# Patient Record
Sex: Female | Born: 1955 | Race: White | Hispanic: No | Smoking: Former smoker
Health system: Southern US, Community
[De-identification: ages and names within clinical notes are randomized; demographics above are authoritative.]

## PROBLEM LIST (undated history)

## (undated) DIAGNOSIS — I739 Peripheral vascular disease, unspecified: Secondary | ICD-10-CM

## (undated) DIAGNOSIS — F329 Major depressive disorder, single episode, unspecified: Secondary | ICD-10-CM

## (undated) DIAGNOSIS — R51 Headache: Secondary | ICD-10-CM

## (undated) DIAGNOSIS — F32A Depression, unspecified: Secondary | ICD-10-CM

## (undated) DIAGNOSIS — T7840XA Allergy, unspecified, initial encounter: Secondary | ICD-10-CM

## (undated) DIAGNOSIS — F509 Eating disorder, unspecified: Secondary | ICD-10-CM

## (undated) HISTORY — DX: Allergy, unspecified, initial encounter: T78.40XA

## (undated) HISTORY — DX: Eating disorder, unspecified: F50.9

## (undated) HISTORY — DX: Peripheral vascular disease, unspecified: I73.9

## (undated) HISTORY — PX: COLONOSCOPY: SHX174

## (undated) HISTORY — PX: SKIN CANCER EXCISION: SHX779

## (undated) HISTORY — DX: Major depressive disorder, single episode, unspecified: F32.9

## (undated) HISTORY — DX: Headache: R51

## (undated) HISTORY — PX: TONSILLECTOMY: SHX5217

## (undated) HISTORY — DX: Depression, unspecified: F32.A

---

## 1998-07-08 ENCOUNTER — Other Ambulatory Visit: Admission: RE | Admit: 1998-07-08 | Discharge: 1998-07-08 | Payer: Self-pay | Admitting: *Deleted

## 1999-08-10 ENCOUNTER — Other Ambulatory Visit: Admission: RE | Admit: 1999-08-10 | Discharge: 1999-08-10 | Payer: Self-pay | Admitting: *Deleted

## 2000-08-16 ENCOUNTER — Other Ambulatory Visit: Admission: RE | Admit: 2000-08-16 | Discharge: 2000-08-16 | Payer: Self-pay | Admitting: *Deleted

## 2001-11-11 ENCOUNTER — Other Ambulatory Visit: Admission: RE | Admit: 2001-11-11 | Discharge: 2001-11-11 | Payer: Self-pay | Admitting: Obstetrics and Gynecology

## 2002-12-25 ENCOUNTER — Other Ambulatory Visit: Admission: RE | Admit: 2002-12-25 | Discharge: 2002-12-25 | Payer: Self-pay | Admitting: Obstetrics and Gynecology

## 2004-02-29 ENCOUNTER — Other Ambulatory Visit: Admission: RE | Admit: 2004-02-29 | Discharge: 2004-02-29 | Payer: Self-pay | Admitting: Obstetrics and Gynecology

## 2005-06-05 ENCOUNTER — Other Ambulatory Visit: Admission: RE | Admit: 2005-06-05 | Discharge: 2005-06-05 | Payer: Self-pay | Admitting: Obstetrics and Gynecology

## 2007-11-12 ENCOUNTER — Ambulatory Visit: Payer: Self-pay | Admitting: Family Medicine

## 2007-11-12 ENCOUNTER — Ambulatory Visit: Payer: Self-pay | Admitting: Internal Medicine

## 2007-11-12 DIAGNOSIS — R519 Headache, unspecified: Secondary | ICD-10-CM | POA: Insufficient documentation

## 2007-11-12 DIAGNOSIS — J309 Allergic rhinitis, unspecified: Secondary | ICD-10-CM | POA: Insufficient documentation

## 2007-11-12 DIAGNOSIS — R51 Headache: Secondary | ICD-10-CM | POA: Insufficient documentation

## 2007-11-19 ENCOUNTER — Ambulatory Visit: Payer: Self-pay

## 2007-11-25 ENCOUNTER — Ambulatory Visit: Payer: Self-pay | Admitting: Family Medicine

## 2008-04-20 ENCOUNTER — Other Ambulatory Visit: Admission: RE | Admit: 2008-04-20 | Discharge: 2008-04-20 | Payer: Self-pay | Admitting: Obstetrics and Gynecology

## 2008-12-22 ENCOUNTER — Telehealth: Payer: Self-pay | Admitting: Family Medicine

## 2009-03-10 ENCOUNTER — Ambulatory Visit: Payer: Self-pay | Admitting: Family Medicine

## 2009-03-10 DIAGNOSIS — L03019 Cellulitis of unspecified finger: Secondary | ICD-10-CM

## 2009-03-10 DIAGNOSIS — L02519 Cutaneous abscess of unspecified hand: Secondary | ICD-10-CM | POA: Insufficient documentation

## 2009-03-21 ENCOUNTER — Ambulatory Visit: Payer: Self-pay | Admitting: Family Medicine

## 2009-03-25 ENCOUNTER — Ambulatory Visit: Payer: Self-pay | Admitting: Family Medicine

## 2009-03-29 ENCOUNTER — Ambulatory Visit: Payer: Self-pay | Admitting: Family Medicine

## 2009-03-31 ENCOUNTER — Ambulatory Visit: Payer: Self-pay | Admitting: Family Medicine

## 2009-03-31 LAB — CONVERTED CEMR LAB
ALT: 21 units/L (ref 0–35)
AST: 25 units/L (ref 0–37)
Alkaline Phosphatase: 57 units/L (ref 39–117)
BUN: 17 mg/dL (ref 6–23)
Basophils Relative: 0.1 % (ref 0.0–3.0)
Bilirubin, Direct: 0.1 mg/dL (ref 0.0–0.3)
Chloride: 103 meq/L (ref 96–112)
Eosinophils Absolute: 0.1 10*3/uL (ref 0.0–0.7)
Eosinophils Relative: 1.3 % (ref 0.0–5.0)
Glucose, Bld: 91 mg/dL (ref 70–99)
Hemoglobin, Urine: NEGATIVE
LDL Cholesterol: 76 mg/dL (ref 0–99)
Lymphocytes Relative: 18.6 % (ref 12.0–46.0)
Monocytes Relative: 11.5 % (ref 3.0–12.0)
Neutrophils Relative %: 68.5 % (ref 43.0–77.0)
Potassium: 4.5 meq/L (ref 3.5–5.1)
RBC: 4.49 M/uL (ref 3.87–5.11)
Total CHOL/HDL Ratio: 3
Total Protein, Urine: NEGATIVE mg/dL
Total Protein: 6.2 g/dL (ref 6.0–8.3)
Urine Glucose: NEGATIVE mg/dL
VLDL: 8.4 mg/dL (ref 0.0–40.0)
WBC: 7.3 10*3/uL (ref 4.5–10.5)

## 2009-04-11 ENCOUNTER — Ambulatory Visit: Payer: Self-pay | Admitting: Family Medicine

## 2009-04-11 DIAGNOSIS — R5383 Other fatigue: Secondary | ICD-10-CM

## 2009-04-11 DIAGNOSIS — R252 Cramp and spasm: Secondary | ICD-10-CM | POA: Insufficient documentation

## 2009-04-11 DIAGNOSIS — R5381 Other malaise: Secondary | ICD-10-CM | POA: Insufficient documentation

## 2010-02-08 ENCOUNTER — Encounter: Payer: Self-pay | Admitting: Family Medicine

## 2010-02-08 ENCOUNTER — Ambulatory Visit: Payer: Self-pay | Admitting: Internal Medicine

## 2010-08-02 ENCOUNTER — Ambulatory Visit
Admission: RE | Admit: 2010-08-02 | Discharge: 2010-08-02 | Payer: Self-pay | Source: Home / Self Care | Attending: Family Medicine | Admitting: Family Medicine

## 2010-08-02 DIAGNOSIS — L253 Unspecified contact dermatitis due to other chemical products: Secondary | ICD-10-CM | POA: Insufficient documentation

## 2010-08-20 LAB — CONVERTED CEMR LAB
ALT: 21 units/L (ref 0–35)
AST: 26 units/L (ref 0–37)
Basophils Absolute: 0 10*3/uL (ref 0.0–0.1)
Basophils Relative: 0.1 % (ref 0.0–1.0)
Bilirubin, Direct: 0.1 mg/dL (ref 0.0–0.3)
CO2: 32 meq/L (ref 19–32)
Chloride: 104 meq/L (ref 96–112)
Cholesterol: 146 mg/dL (ref 0–200)
Creatinine, Ser: 0.6 mg/dL (ref 0.4–1.2)
Glucose, Bld: 98 mg/dL (ref 70–99)
Hemoglobin: 14.1 g/dL (ref 12.0–15.0)
LDL Cholesterol: 74 mg/dL (ref 0–99)
Lymphocytes Relative: 31.5 % (ref 12.0–46.0)
MCHC: 33.6 g/dL (ref 30.0–36.0)
Monocytes Relative: 9.6 % (ref 3.0–12.0)
Neutrophils Relative %: 57.7 % (ref 43.0–77.0)
Nitrite: NEGATIVE
Protein, U semiquant: NEGATIVE
RBC: 4.71 M/uL (ref 3.87–5.11)
Total Bilirubin: 0.7 mg/dL (ref 0.3–1.2)
Total CHOL/HDL Ratio: 2.4
Total Protein: 6.8 g/dL (ref 6.0–8.3)
Urobilinogen, UA: 0.2
VLDL: 12 mg/dL (ref 0–40)
WBC Urine, dipstick: NEGATIVE
WBC: 5 10*3/uL (ref 4.5–10.5)

## 2010-08-22 NOTE — Miscellaneous (Signed)
Summary: BONE DENSITY  Clinical Lists Changes  Orders: Added new Test order of T-Bone Densitometry (77080) - Signed Added new Test order of T-Lumbar Vertebral Assessment (77082) - Signed 

## 2010-08-24 NOTE — Assessment & Plan Note (Signed)
Summary: inflamed eyes/dm   Vital Signs:  Patient profile:   55 year old female Menstrual status:  postmenopausal Height:      65.5 inches Weight:      162 pounds BMI:     26.64 Temp:     98.1 degrees F oral BP sitting:   120 / 84  (left arm)  Vitals Entered By: Kern Reap CMA Duncan Dull) (August 02, 2010 9:36 AM) CC: eyes swelling   CC:  eyes swelling.  History of Present Illness: Katie Fischer is a 55 year old female, who comes in today for evaluation of a allergic reaction to make up in her eyes.  The past month.  Both eyelids been swollen.  She remove the offending agent however, eructation, and redness is still persists  Allergies: 1)  ! Codeine  Past History:  Past medical, surgical, family and social histories (including risk factors) reviewed for relevance to current acute and chronic problems.  Past Medical History: Reviewed history from 11/25/2007 and no changes required. Allergic rhinitis Headache tonsillectomy teenage eating disorder Depression Peripheral vascular disease  carotid and aortic bruits  Family History: Reviewed history from 11/12/2007 and no changes required. father died at 52.  COPD smoker  mother died 57, diabetes, high blood pressure, hyperlipidemia, congestive heart failure, hypothyroidism, and alcoholism .  No brother,sone sister in good health  Social History: Reviewed history from 11/12/2007 and no changes required. 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 Uad  Complete Medication List: 1)  Multivitamins Caps (Multiple vitamin) 2)  Calcium  3)  Fish Oil Oil (Fish oil) 4)  Vitamin E Complete Caps (Vitamin mixture) 5)  Aspirin 81 Mg Tbec (Aspirin) 6)  Doxycycline Hyclate 100 Mg Caps (Doxycycline hyclate) .... One tab two times a day 7)  Celexa 20 Mg Tabs (Citalopram hydrobromide) .Marland Kitchen.. 1 tab @ bedtime 8)  Prednisone 20 Mg Tabs (Prednisone) .... Uad  Patient Instructions: 1)  prednisone two tabs x 3 days, one x 3 days, a half x 3 days, then a half a tablet Monday, Wednesday, Friday, for a two week taper.  Return p.r.n. Prescriptions: PREDNISONE 20 MG TABS (PREDNISONE) UAD  #30 x 1   Entered and Authorized by:   Roderick Pee MD   Signed by:   Roderick Pee MD on 08/02/2010   Method used:   Electronically to        [facility] [phone]* (retail)       [address]       [place], [place]  [address]       Ph: [phone] or [phone]       Fax: [phone]   RxID:   [id number]    Orders Added: 1)  Est. Patient Level III [47425]

## 2011-07-09 ENCOUNTER — Ambulatory Visit (INDEPENDENT_AMBULATORY_CARE_PROVIDER_SITE_OTHER): Payer: BC Managed Care – PPO | Admitting: Family Medicine

## 2011-07-09 ENCOUNTER — Encounter: Payer: Self-pay | Admitting: Family Medicine

## 2011-07-09 VITALS — BP 108/74 | Temp 98.1°F | Ht 65.5 in | Wt 156.0 lb

## 2011-07-09 DIAGNOSIS — J45901 Unspecified asthma with (acute) exacerbation: Secondary | ICD-10-CM

## 2011-07-09 MED ORDER — PREDNISONE 20 MG PO TABS: ORAL_TABLET | ORAL | Status: DC

## 2011-07-09 MED ORDER — HYDROCODONE-HOMATROPINE 5-1.5 MG/5ML PO SYRP: ORAL_SOLUTION | ORAL | Status: DC

## 2011-07-09 MED ORDER — DOXYCYCLINE HYCLATE 100 MG PO TABS: 100.0000 mg | ORAL_TABLET | Freq: Two times a day (BID) | ORAL | Status: AC

## 2011-07-09 NOTE — Patient Instructions (Signed)
Drinks lots of water.  Run a vaporizer in her bedroom at night.  Prednisone as directed.  Doxycycline one twice a day for 10 days for the discolored sputum.  Hydromet one half to 1 teaspoon up to 3 times daily p.r.n. For cough.  Return p.r.n.

## 2011-07-09 NOTE — Progress Notes (Signed)
  Subjective:    Patient ID: Christyne Mccain, female    DOB: Jun 26, 1956, 55 y.o.   MRN: [id number]  HPI Dareen is a 55 year old, married female, nonsmoker, who comes in today with a cough for 3 weeks.  She states that 3 weeks ago she developed a cold and cold symptoms went away, but over the weekend, she began wheezing.  She's never had problems asthma in the past.  Then, nonsmoker.  No fever, some discolored sputum, but otherwise well.    Review of Systems    General and pulmonary review of systems otherwise negative, except she has had a history of allergic rhinitis in the past Objective:   Physical Exam Well-developed well-nourished, female, in no acute distress.  HEENT negative.  Neck was supple.  No adenopathy.  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  Plan lots of liquids, Hydromet, doxycycline, 100 b.i.d., prednisone burst and taper,

## 2012-01-07 ENCOUNTER — Other Ambulatory Visit (INDEPENDENT_AMBULATORY_CARE_PROVIDER_SITE_OTHER): Payer: 59

## 2012-01-07 DIAGNOSIS — Z0000 Encounter for general adult medical examination without abnormal findings: Secondary | ICD-10-CM

## 2012-01-07 LAB — POCT URINALYSIS DIPSTICK
Bilirubin, UA: NEGATIVE
Ketones, UA: NEGATIVE
Leukocytes, UA: NEGATIVE
Nitrite, UA: NEGATIVE
Protein, UA: NEGATIVE

## 2012-01-07 LAB — HEPATIC FUNCTION PANEL
ALT: 32 U/L (ref 0–35)
AST: 37 U/L (ref 0–37)
Albumin: 3.7 g/dL (ref 3.5–5.2)
Alkaline Phosphatase: 65 U/L (ref 39–117)

## 2012-01-07 LAB — LIPID PANEL
Total CHOL/HDL Ratio: 2
VLDL: 5.8 mg/dL (ref 0.0–40.0)

## 2012-01-07 LAB — CBC WITH DIFFERENTIAL/PLATELET
Eosinophils Relative: 0.9 % (ref 0.0–5.0)
HCT: 40.1 % (ref 36.0–46.0)
Hemoglobin: 12.9 g/dL (ref 12.0–15.0)
Lymphs Abs: 1.5 10*3/uL (ref 0.7–4.0)
Monocytes Relative: 11.4 % (ref 3.0–12.0)
Neutro Abs: 2.9 10*3/uL (ref 1.4–7.7)
RBC: 4.51 Mil/uL (ref 3.87–5.11)
WBC: 5.1 10*3/uL (ref 4.5–10.5)

## 2012-01-07 LAB — BASIC METABOLIC PANEL
GFR: 107.6 mL/min (ref >60.00)
Potassium: 3.9 mEq/L (ref 3.5–5.1)
Sodium: 139 mEq/L (ref 135–145)

## 2012-01-07 LAB — TSH: TSH: 1.7 u[IU]/mL (ref 0.35–5.50)

## 2012-01-14 ENCOUNTER — Other Ambulatory Visit: Payer: BC Managed Care – PPO

## 2012-02-04 ENCOUNTER — Ambulatory Visit (INDEPENDENT_AMBULATORY_CARE_PROVIDER_SITE_OTHER): Payer: 59 | Admitting: Family Medicine

## 2012-02-04 ENCOUNTER — Encounter: Payer: Self-pay | Admitting: Family Medicine

## 2012-02-04 VITALS — BP 124/80 | Temp 98.0°F | Ht 65.25 in | Wt 155.0 lb

## 2012-02-04 DIAGNOSIS — R5383 Other fatigue: Secondary | ICD-10-CM

## 2012-02-04 DIAGNOSIS — Z0000 Encounter for general adult medical examination without abnormal findings: Secondary | ICD-10-CM | POA: Insufficient documentation

## 2012-02-04 DIAGNOSIS — R5381 Other malaise: Secondary | ICD-10-CM

## 2012-02-04 MED ORDER — AMITRIPTYLINE HCL 10 MG PO TABS: 10.0000 mg | ORAL_TABLET | Freq: Every day | ORAL | Status: DC

## 2012-02-04 NOTE — Patient Instructions (Signed)
Take Elavil 10 mg at bedtime  Only have one cup of caffeinated beverages in the morning  Walk 30 minutes daily  Return in one year sooner if any problems

## 2012-02-04 NOTE — Progress Notes (Signed)
  Subjective:    Patient ID: Katie Fischer, female    DOB: [birth year], 56 y.o.   MRN: [id number]  HPI Katie Fischer is a 56 year old married female nonsmoker who comes in today for general physical examination  She's always been in good health she's had no chronic health problems. She states she is working full time and now feels tired and fatigued for the past year. Her sleep is interrupted by urination x2 however she also wakes up thinking of things she needs to do.  She gets routine eye care, dental care, BSE monthly, and you mammography, tetanus 2010  Pap year ago normal recommended Pap every 3 years   Review of Systems  Constitutional: Negative.   HENT: Negative.   Eyes: Negative.   Respiratory: Negative.   Cardiovascular: Negative.   Gastrointestinal: Negative.   Genitourinary: Negative.   Musculoskeletal: Negative.   Neurological: Negative.   Hematological: Negative.   Psychiatric/Behavioral: Negative.        Objective:   Physical Exam  Constitutional: She appears well-developed and well-nourished.  HENT:  Head: Normocephalic and atraumatic.  Right Ear: External ear normal.  Left Ear: External ear normal.  Nose: Nose normal.  Mouth/Throat: Oropharynx is clear and moist.  Eyes: EOM are normal. Pupils are equal, round, and reactive to light.  Neck: Normal range of motion. Neck supple. No thyromegaly present.  Cardiovascular: Normal rate, regular rhythm, normal heart sounds and intact distal pulses.  Exam reveals no gallop and no friction rub.   No murmur heard. Pulmonary/Chest: Effort normal and breath sounds normal.  Abdominal: Soft. Bowel sounds are normal. She exhibits no distension and no mass. There is no tenderness. There is no rebound.  Genitourinary:       Bilateral breast exam normal  Musculoskeletal: Normal range of motion.  Lymphadenopathy:    She has no cervical adenopathy.  Neurological: She is alert. She has normal reflexes. No cranial nerve deficit. She  exhibits normal muscle tone. Coordination normal.  Skin: Skin is warm and dry.       Total body skin exam normal  Psychiatric: She has a normal mood and affect. Her behavior is normal. Judgment and thought content normal.          Assessment & Plan:  Healthy female  Fatigue probably secondary to sleep dysfunction trial of low-dose Elavil

## 2012-09-30 ENCOUNTER — Encounter: Payer: Self-pay | Admitting: Family Medicine

## 2013-01-21 ENCOUNTER — Other Ambulatory Visit: Payer: Self-pay | Admitting: Family Medicine

## 2013-03-27 ENCOUNTER — Other Ambulatory Visit (INDEPENDENT_AMBULATORY_CARE_PROVIDER_SITE_OTHER): Payer: 59

## 2013-03-27 DIAGNOSIS — Z0000 Encounter for general adult medical examination without abnormal findings: Secondary | ICD-10-CM

## 2013-03-27 LAB — HEPATIC FUNCTION PANEL
ALT: 28 U/L (ref 0–35)
Alkaline Phosphatase: 53 U/L (ref 39–117)
Bilirubin, Direct: 0 mg/dL (ref 0.0–0.3)
Total Protein: 6.3 g/dL (ref 6.0–8.3)

## 2013-03-27 LAB — CBC WITH DIFFERENTIAL/PLATELET
Basophils Relative: 0.7 % (ref 0.0–3.0)
Eosinophils Relative: 1.6 % (ref 0.0–5.0)
HCT: 40.8 % (ref 36.0–46.0)
Hemoglobin: 13.5 g/dL (ref 12.0–15.0)
MCV: 87.2 fl (ref 78.0–100.0)
Monocytes Absolute: 0.5 10*3/uL (ref 0.1–1.0)
Neutrophils Relative %: 55.7 % (ref 43.0–77.0)
RBC: 4.68 Mil/uL (ref 3.87–5.11)
WBC: 5.2 10*3/uL (ref 4.5–10.5)

## 2013-03-27 LAB — BASIC METABOLIC PANEL
Chloride: 102 mEq/L (ref 96–112)
Creatinine, Ser: 0.6 mg/dL (ref 0.4–1.2)
Potassium: 3.8 mEq/L (ref 3.5–5.1)
Sodium: 137 mEq/L (ref 135–145)

## 2013-03-27 LAB — LIPID PANEL
LDL Cholesterol: 78 mg/dL (ref 0–99)
Total CHOL/HDL Ratio: 3
VLDL: 11.2 mg/dL (ref 0.0–40.0)

## 2013-03-27 LAB — POCT URINALYSIS DIPSTICK
Ketones, UA: NEGATIVE
Protein, UA: NEGATIVE
Spec Grav, UA: 1.01
pH, UA: 7

## 2013-04-14 ENCOUNTER — Encounter: Payer: Self-pay | Admitting: Internal Medicine

## 2013-04-14 ENCOUNTER — Ambulatory Visit (INDEPENDENT_AMBULATORY_CARE_PROVIDER_SITE_OTHER): Payer: 59 | Admitting: Internal Medicine

## 2013-04-14 VITALS — BP 126/70 | HR 71 | Temp 98.4°F | Resp 18 | Ht 65.75 in | Wt 159.0 lb

## 2013-04-14 DIAGNOSIS — R252 Cramp and spasm: Secondary | ICD-10-CM

## 2013-04-14 DIAGNOSIS — Z23 Encounter for immunization: Secondary | ICD-10-CM

## 2013-04-14 DIAGNOSIS — Z0000 Encounter for general adult medical examination without abnormal findings: Secondary | ICD-10-CM

## 2013-04-14 MED ORDER — AMITRIPTYLINE HCL 10 MG PO TABS: ORAL_TABLET | ORAL | Status: DC

## 2013-04-14 NOTE — Progress Notes (Signed)
Subjective:    Patient ID: Katie Fischer, female    DOB: 03-25-1956, 57 y.o.   MRN: [id number]  HPI  57 year old patient who is seen today for a preventive health examination. She enjoys excellent health. Her only concerns are occasional insomnia as well as nocturnal leg cramps. Her last colonoscopy was about 5 years ago. She does exercise regularly at the [facility] 3-4 times per week.  Past medical history otherwise unremarkable Past surgical history colonoscopy 5 years ago;  remote tonsillectomy in the fourth grade  Social history. 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 3 dogs; discontinued tobacco use in 12-17-1987  Family history father died at age 75 complications of COPD and a nonmalignant colonic obstruction Mother died at 25 history of alcohol use pernicious anemia chronic kidney disease diabetes and hypertension   Past Medical History  Diagnosis Date  . Allergy   . Headache(784.0)   . Eating disorder     as a teenager  . Depression   . PVD (peripheral vascular disease)     History   Social History  . Marital Status: Married    Spouse Name: N/A    Number of Children: N/A  . Years of Education: N/A   Occupational History  . Not on file.   Social History Main Topics  . Smoking status: Former Games developer  . Smokeless tobacco: Not on file  . Alcohol Use: Yes  . Drug Use: No  . Sexual Activity:    Other Topics Concern  . Not on file   Social History Narrative  . No narrative on file    Past Surgical History  Procedure Laterality Date  . Tonsillectomy      Family History  Problem Relation Age of Onset  . Diabetes Mother   . Hypertension Mother   . Hyperlipidemia Mother   . Alcohol abuse Mother   . Thyroid disease Mother   . COPD Father     Allergies  Allergen Reactions  . Codeine     REACTION: hives    Current Outpatient Prescriptions on File Prior to Visit  Medication Sig Dispense Refill  . amitriptyline (ELAVIL) 10 MG tablet TAKE ONE  TABLET BY MOUTH AT BEDTIME  100 tablet  3  . aspirin 81 MG tablet Take 81 mg by mouth daily.        . calcium carbonate 200 MG capsule Take 250 mg by mouth daily.        . fish oil-omega-3 fatty acids 1000 MG capsule Take 1 g by mouth daily.        . Multiple Vitamin (MULTIVITAMIN) tablet Take 1 tablet by mouth daily.        . Probiotic Product (PROBIOTIC DAILY PO) Take by mouth.      . Vitamin Mixture (VITAMIN E COMPLETE PO) Take 1 capsule by mouth.         No current facility-administered medications on file prior to visit.    BP 126/70  Pulse 71  Temp(Src) 98.4 F (36.9 C) (Oral)  Resp 18  Ht 5' 5.75" (1.67 m)  Wt 159 lb (72.122 kg)  BMI 25.86 kg/m2  SpO2 98%     Review of Systems  Constitutional: Negative for fever, appetite change, fatigue and unexpected weight change.  HENT: Negative for hearing loss, ear pain, nosebleeds, congestion, sore throat, mouth sores, trouble swallowing, neck stiffness, dental problem, voice change, sinus pressure and tinnitus.   Eyes: Negative for photophobia, pain, redness and visual disturbance.  Respiratory:  Negative for cough, chest tightness and shortness of breath.   Cardiovascular: Negative for chest pain, palpitations and leg swelling.  Gastrointestinal: Negative for nausea, vomiting, abdominal pain, diarrhea, constipation, blood in stool, abdominal distention and rectal pain.  Genitourinary: Negative for dysuria, urgency, frequency, hematuria, flank pain, vaginal bleeding, vaginal discharge, difficulty urinating, genital sores, vaginal pain, menstrual problem and pelvic pain.  Musculoskeletal: Negative for back pain and arthralgias.  Skin: Negative for rash.  Neurological: Negative for dizziness, syncope, speech difficulty, weakness, light-headedness, numbness and headaches.  Hematological: Negative for adenopathy. Does not bruise/bleed easily.  Psychiatric/Behavioral: Negative for suicidal ideas, behavioral problems, self-injury,  dysphoric mood and agitation. The patient is not nervous/anxious.        Objective:   Physical Exam  Constitutional: She is oriented to person, place, and time. She appears well-developed and well-nourished.  HENT:  Head: Normocephalic and atraumatic.  Right Ear: External ear normal.  Left Ear: External ear normal.  Mouth/Throat: Oropharynx is clear and moist.  Eyes: Conjunctivae and EOM are normal.  Neck: Normal range of motion. Neck supple. No JVD present. No thyromegaly present.  Cardiovascular: Normal rate, regular rhythm, normal heart sounds and intact distal pulses.   No murmur heard. Pulmonary/Chest: Effort normal and breath sounds normal. She has no wheezes. She has no rales.  Abdominal: Soft. Bowel sounds are normal. She exhibits no distension and no mass. There is no tenderness. There is no rebound and no guarding.  Musculoskeletal: Normal range of motion. She exhibits no edema and no tenderness.  Neurological: She is alert and oriented to person, place, and time. She has normal reflexes. No cranial nerve deficit. She exhibits normal muscle tone. Coordination normal.  Skin: Skin is warm and dry. No rash noted.  Psychiatric: She has a normal mood and affect. Her behavior is normal.          Assessment & Plan:   Preventive health examination Nocturnal leg cramps. We'll try stretching prior to bedtime  Laboratory studies reviewed Recheck in one year or as needed Continue exercise regimen and heart healthy diet

## 2013-04-14 NOTE — Patient Instructions (Addendum)
It is important that you exercise regularly, at least 20 minutes 3 to 4 times per week.  If you develop chest pain or shortness of breath seek  medical attention.  Take a calcium supplement, plus [phone] units of vitamin D  Return in one year for follow-up Leg Cramps Leg cramps that occur during exercise can be caused by poor circulation or dehydration. However, muscle cramps that occur at rest or during the night are usually not due to any serious medical problem. Heat cramps may cause muscle spasms during hot weather.  CAUSES There is no clear cause for muscle cramps. However, dehydration may be a factor for those who do not drink enough fluids and those who exercise in the heat. Imbalances in the level of sodium, potassium, calcium or magnesium in the muscle tissue may also be a factor. Some medications, such as water pills (diuretics), may cause loss of chemicals that the body needs (like sodium and potassium) and cause muscle cramps. TREATMENT   Make sure your diet has enough fluids and essential minerals for the muscle to work normally.  Avoid strenuous exercise for several days if you have been having frequent leg cramps.  Stretch and massage the cramped muscle for several minutes.  Some medicines may be helpful in some patients with night cramps. Only take over-the-counter or prescription medicines as directed by your caregiver. SEEK IMMEDIATE MEDICAL CARE IF:   Your leg cramps become worse.  Your foot becomes cold, numb, or blue. Document Released: 08/16/2004 Document Revised: 10/01/2011 Document Reviewed: 08/03/2008 [facility] Patient Information 2014 [place], [place]. Insomnia Insomnia is frequent trouble falling and/or staying asleep. Insomnia can be a long term problem or a short term problem. Both are common. Insomnia can be a short term problem when the wakefulness is related to a certain stress or worry. Long term insomnia is often related to ongoing stress during waking  hours and/or poor sleeping habits. Overtime, sleep deprivation itself can make the problem worse. Every little thing feels more severe because you are overtired and your ability to cope is decreased. CAUSES   Stress, anxiety, and depression.  Poor sleeping habits.  Distractions such as TV in the bedroom.  Naps close to bedtime.  Engaging in emotionally charged conversations before bed.  Technical reading before sleep.  Alcohol and other sedatives. They may make the problem worse. They can hurt normal sleep patterns and normal dream activity.  Stimulants such as caffeine for several hours prior to bedtime.  Pain syndromes and shortness of breath can cause insomnia.  Exercise late at night.  Changing time zones may cause sleeping problems (jet lag). It is sometimes helpful to have someone observe your sleeping patterns. They should look for periods of not breathing during the night (sleep apnea). They should also look to see how long those periods last. If you live alone or observers are uncertain, you can also be observed at a sleep clinic where your sleep patterns will be professionally monitored. Sleep apnea requires a checkup and treatment. Give your caregivers your medical history. Give your caregivers observations your family has made about your sleep.  SYMPTOMS   Not feeling rested in the morning.  Anxiety and restlessness at bedtime.  Difficulty falling and staying asleep. TREATMENT   Your caregiver may prescribe treatment for an underlying medical disorders. Your caregiver can give advice or help if you are using alcohol or other drugs for self-medication. Treatment of underlying problems will usually eliminate insomnia problems.  Medications can be prescribed for  short time use. They are generally not recommended for lengthy use.  Over-the-counter sleep medicines are not recommended for lengthy use. They can be habit forming.  You can promote easier sleeping by making  lifestyle changes such as:  Using relaxation techniques that help with breathing and reduce muscle tension.  Exercising earlier in the day.  Changing your diet and the time of your last meal. No night time snacks.  Establish a regular time to go to bed.  Counseling can help with stressful problems and worry.  Soothing music and white noise may be helpful if there are background noises you cannot remove.  Stop tedious detailed work at least one hour before bedtime. HOME CARE INSTRUCTIONS   Keep a diary. Inform your caregiver about your progress. This includes any medication side effects. See your caregiver regularly. Take note of:  Times when you are asleep.  Times when you are awake during the night.  The quality of your sleep.  How you feel the next day. This information will help your caregiver care for you.  Get out of bed if you are still awake after 15 minutes. Read or do some quiet activity. Keep the lights down. Wait until you feel sleepy and go back to bed.  Keep regular sleeping and waking hours. Avoid naps.  Exercise regularly.  Avoid distractions at bedtime. Distractions include watching television or engaging in any intense or detailed activity like attempting to balance the household checkbook.  Develop a bedtime ritual. Keep a familiar routine of bathing, brushing your teeth, climbing into bed at the same time each night, listening to soothing music. Routines increase the success of falling to sleep faster.  Use relaxation techniques. This can be using breathing and muscle tension release routines. It can also include visualizing peaceful scenes. You can also help control troubling or intruding thoughts by keeping your mind occupied with boring or repetitive thoughts like the old concept of counting sheep. You can make it more creative like imagining planting one beautiful flower after another in your backyard garden.  During your day, work to eliminate stress.  When this is not possible use some of the previous suggestions to help reduce the anxiety that accompanies stressful situations. MAKE SURE YOU:   Understand these instructions.  Will watch your condition.  Will get help right away if you are not doing well or get worse. Document Released: 07/06/2000 Document Revised: 10/01/2011 Document Reviewed: 08/06/2007 [facility] Patient Information 2014 [place], [place].

## 2013-05-01 ENCOUNTER — Ambulatory Visit (INDEPENDENT_AMBULATORY_CARE_PROVIDER_SITE_OTHER): Payer: 59 | Admitting: Family

## 2013-05-01 ENCOUNTER — Encounter: Payer: Self-pay | Admitting: Family

## 2013-05-01 VITALS — BP 118/80 | HR 78 | Temp 98.6°F | Wt 158.0 lb

## 2013-05-01 DIAGNOSIS — R059 Cough, unspecified: Secondary | ICD-10-CM

## 2013-05-01 DIAGNOSIS — R05 Cough: Secondary | ICD-10-CM

## 2013-05-01 DIAGNOSIS — J209 Acute bronchitis, unspecified: Secondary | ICD-10-CM

## 2013-05-01 MED ORDER — METHYLPREDNISOLONE 4 MG PO KIT: PACK | ORAL | Status: AC

## 2013-05-01 NOTE — Patient Instructions (Signed)

## 2013-05-01 NOTE — Progress Notes (Signed)
Subjective:    Patient ID: Katie Fischer, female    DOB: 04/08/1956, 57 y.o.   MRN: [id number]  HPI 57 year old white female, nonsmoker, patient of Dr. Tawanna Cooler is in today with complaints of cough, chest congestion, yellow phlegm production 58 days. She's been taking over-the-counter Mucinex DM without much relief. Denies any fever muscle aches or pain.   Review of Systems  Constitutional: Negative.   HENT: Positive for congestion, postnasal drip, rhinorrhea and sinus pressure.   Respiratory: Positive for cough and shortness of breath.   Cardiovascular: Negative.   Gastrointestinal: Negative.   Endocrine: Negative.   Skin: Negative.   Allergic/Immunologic: Negative.   Psychiatric/Behavioral: Negative.    Past Medical History  Diagnosis Date  . Allergy   . Headache(784.0)   . Eating disorder     as a teenager  . Depression   . PVD (peripheral vascular disease)     History   Social History  . Marital Status: Married    Spouse Name: N/A    Number of Children: N/A  . Years of Education: N/A   Occupational History  . Not on file.   Social History Main Topics  . Smoking status: Former Games developer  . Smokeless tobacco: Not on file  . Alcohol Use: Yes  . Drug Use: No  . Sexual Activity:    Other Topics Concern  . Not on file   Social History Narrative  . No narrative on file    Past Surgical History  Procedure Laterality Date  . Tonsillectomy      Family History  Problem Relation Age of Onset  . Diabetes Mother   . Hypertension Mother   . Hyperlipidemia Mother   . Alcohol abuse Mother   . Thyroid disease Mother   . COPD Father     Allergies  Allergen Reactions  . Codeine     REACTION: hives    Current Outpatient Prescriptions on File Prior to Visit  Medication Sig Dispense Refill  . amitriptyline (ELAVIL) 10 MG tablet TAKE ONE TABLET BY MOUTH AT BEDTIME  100 tablet  3  . aspirin 81 MG tablet Take 81 mg by mouth daily.        . calcium carbonate 200 MG  capsule Take 250 mg by mouth daily.        . fish oil-omega-3 fatty acids 1000 MG capsule Take 1 g by mouth daily.        . Multiple Vitamin (MULTIVITAMIN) tablet Take 1 tablet by mouth daily.        . Probiotic Product (PROBIOTIC DAILY PO) Take by mouth.      . Vitamin Mixture (VITAMIN E COMPLETE PO) Take 1 capsule by mouth.         No current facility-administered medications on file prior to visit.    BP 118/80  Pulse 78  Temp(Src) 98.6 F (37 C) (Oral)  Wt 158 lb (71.668 kg)  BMI 25.7 kg/m2chart    Objective:   Physical Exam  Constitutional: She is oriented to person, place, and time. She appears well-developed and well-nourished.  HENT:  Right Ear: External ear normal.  Left Ear: External ear normal.  Nose: Nose normal.  Mouth/Throat: Oropharynx is clear and moist.  Neck: Normal range of motion. Neck supple.  Cardiovascular: Normal rate, regular rhythm and normal heart sounds.   Pulmonary/Chest: Effort normal and breath sounds normal.  Musculoskeletal: Normal range of motion.  Neurological: She is alert and oriented to person, place, and time.  Skin: Skin  is warm and dry.  Psychiatric: She has a normal mood and affect.          Assessment & Plan:  Assessment: 1. Acute bronchitis 2. Cough  Plan: Medrol Dosepak as directed. Rest. Drink plenty of fluids. Patient to call the office if symptoms worsen or persist. Recheck as scheduled, and as needed.

## 2013-05-04 ENCOUNTER — Ambulatory Visit (INDEPENDENT_AMBULATORY_CARE_PROVIDER_SITE_OTHER): Payer: 59 | Admitting: Family Medicine

## 2013-05-04 ENCOUNTER — Encounter: Payer: Self-pay | Admitting: Family Medicine

## 2013-05-04 ENCOUNTER — Telehealth: Payer: Self-pay | Admitting: Family Medicine

## 2013-05-04 VITALS — BP 118/78 | Temp 98.0°F | Wt 156.0 lb

## 2013-05-04 DIAGNOSIS — J45901 Unspecified asthma with (acute) exacerbation: Secondary | ICD-10-CM

## 2013-05-04 MED ORDER — PREDNISONE 20 MG PO TABS: ORAL_TABLET | ORAL | Status: DC

## 2013-05-04 MED ORDER — LORAZEPAM 1 MG PO TABS: 1.0000 mg | ORAL_TABLET | Freq: Two times a day (BID) | ORAL | Status: DC | PRN

## 2013-05-04 NOTE — Patient Instructions (Signed)
Prednisone 20 mg,,,,,,,,,,, 3 tabs now then 3 tabs every morning and 2 clear then taper,,,,,,,,,,, 2 tabs x3 days, 1 tab x3 days, a half a tab x3 days, then a half a tablet Monday Wednesday Friday for a 3 week taper  Return in one week for followup  Drink lots of water  Ativan 1 mg 3 times a day when necessary

## 2013-05-04 NOTE — Telephone Encounter (Signed)
Patient Information:  Caller Name: Effie  Phone: [phone]  Patient: Katie Fischer, Katie Fischer  Gender: Female  DOB: 15-Jul-1956  Age: 57 Years  PCP: Kelle Darting [facility])  Office Follow Up:  Does the office need to follow up with this patient?: No  Instructions For The Office: N/A  RN Note:  Significant thick yellow rhinorrhea with productive cough.  Woke up with eye mucus matting eyelashes that  has not reocurred. Some wheezing present.  Symptoms  Reason For Call & Symptoms: Productive cough with nasal congestion.  seen 05/01/13; diagnosed with viral URI.  On Prednisone taper without improvement.  Reviewed Health History In EMR: Yes  Reviewed Medications In EMR: Yes  Reviewed Allergies In EMR: Yes  Reviewed Surgeries / Procedures: Yes  Date of Onset of Symptoms: 04/22/2013  Treatments Tried: Tylenol Cold, Mucinex DM, Prednisone taper.  Treatments Tried Worked: No  Guideline(s) Used:  Colds  Cough  Disposition Per Guideline:   Go to Office Now  Reason For Disposition Reached:   Wheezing is present  Advice Given:  Reassurance  Coughing is the way that our lungs remove irritants and mucus. It helps protect our lungs from getting pneumonia.  You can get a dry hacking cough after a chest cold. Sometimes this type of cough can last 1-3 weeks, and be worse at night.  Coughing Spasms:  Drink warm fluids. Inhale warm mist (Reason: both relax the airway and loosen up the phlegm).  Suck on cough drops or hard candy to coat the irritated throat.  Prevent Dehydration:  Drink adequate liquids.  This will help soothe an irritated or dry throat and loosen up the phlegm.  Expected Course:   The expected course depends on what is causing the cough.  Viral bronchitis (chest cold) causes a cough that lasts 1 to 3 weeks. Sometimes you may cough up lots of phlegm (sputum, mucus). The mucus can normally be white, gray, yellow, or green.  Call Back If:  Difficulty breathing  Cough lasts  more than 3 weeks  You become worse.  Patient Will Follow Care Advice:  YES  Appointment Scheduled:  05/04/2013 12:00:00 Appointment Scheduled Provider:  Kelle Darting [facility])

## 2013-05-04 NOTE — Progress Notes (Signed)
  Subjective:    Patient ID: Katie Fischer, female    DOB: 1956/03/13, 57 y.o.   MRN: [id number]  HPI Katie Fischer is a 57 year old married female nonsmoker who comes in today for evaluation of asthma  Every fall she develops asthma. She was in a week ago and was given a Medrol Dosepak however it doesn't seem to help. No fever review of systems otherwise negative. She's unable to sleep at night because of coughing.   Review of Systems    review of systems otherwise negative Objective:   Physical Exam  Well-developed well-nourished female no acute distress vital signs stable she is afebrile respiratory rate 12 and unlabored  HEENT negative neck was supple no adenopathy lungs are clear except for symmetrical moderate wheezing inspiration and expiration      Assessment & Plan:  Asthma plan increase prednisone to 60 mg daily until clear then taper Ativan each bedtime because of the side effects from the prednisone followup in one week

## 2013-05-12 ENCOUNTER — Encounter: Payer: Self-pay | Admitting: Family Medicine

## 2013-05-12 ENCOUNTER — Ambulatory Visit (INDEPENDENT_AMBULATORY_CARE_PROVIDER_SITE_OTHER): Payer: 59 | Admitting: Family Medicine

## 2013-05-12 VITALS — BP 120/84 | HR 82 | Temp 98.2°F | Wt 161.0 lb

## 2013-05-12 DIAGNOSIS — J45901 Unspecified asthma with (acute) exacerbation: Secondary | ICD-10-CM

## 2013-05-12 NOTE — Patient Instructions (Signed)
Prednisone 20 mg,,,,,,,,, 2 tabs x3 days, 1 tab x3 days, a half a tab x3 days, then a half a tablet Monday Wednesday Friday for a 3 week taper

## 2013-05-12 NOTE — Progress Notes (Signed)
  Subjective:    Patient ID: Katie Fischer, female    DOB: May 01, 1956, 57 y.o.   MRN: [id number]  HPI Katie Fischer is a 57 year old nonsmoking female who comes in today for followup of asthma  We started on 60 mg of prednisone daily she comes back today for followup. She says she is about 50% better. Still has some coughing and wheezing able to sleep at night with the Ativan   Review of Systems Review of systems negative    Objective:   Physical Exam Well-developed well-nourished female no acute distress examination the lungs show symmetrical breath sounds mild late expiratory wheezing bilaterally       Assessment & Plan:  Asthma resolving taper prednisone return when necessary

## 2013-05-22 ENCOUNTER — Ambulatory Visit (INDEPENDENT_AMBULATORY_CARE_PROVIDER_SITE_OTHER): Payer: 59 | Admitting: Family Medicine

## 2013-05-22 ENCOUNTER — Encounter: Payer: Self-pay | Admitting: Family Medicine

## 2013-05-22 VITALS — BP 132/80 | HR 97 | Temp 98.0°F | Wt 160.0 lb

## 2013-05-22 DIAGNOSIS — S76012A Strain of muscle, fascia and tendon of left hip, initial encounter: Secondary | ICD-10-CM

## 2013-05-22 DIAGNOSIS — IMO0002 Reserved for concepts with insufficient information to code with codable children: Secondary | ICD-10-CM

## 2013-05-22 NOTE — Progress Notes (Signed)
  Subjective:    Patient ID: Cataleia Gade, female    DOB: 01/29/1956, 57 y.o.   MRN: [id number]  HPI Here for one week of sharp pains in the left groin area. No trauma but she works out hard at her gym with weights, Zumba classes, etc. Heat and Advil help.    Review of Systems  Constitutional: Negative.   Musculoskeletal: Positive for arthralgias.       Objective:   Physical Exam  Constitutional: She appears well-developed and well-nourished.  Musculoskeletal:  Tender over the left hip flexors and their insertion onto the pelvis. The hip shows full ROM           Assessment & Plan:  Rest, ice, Advil. No strenuous exercises for 4-8-weeks

## 2013-06-23 ENCOUNTER — Telehealth: Payer: Self-pay | Admitting: Family Medicine

## 2013-06-23 ENCOUNTER — Ambulatory Visit (INDEPENDENT_AMBULATORY_CARE_PROVIDER_SITE_OTHER): Payer: 59 | Admitting: Family Medicine

## 2013-06-23 ENCOUNTER — Encounter: Payer: Self-pay | Admitting: Family Medicine

## 2013-06-23 VITALS — BP 134/80 | HR 90 | Temp 99.3°F | Wt 157.0 lb

## 2013-06-23 DIAGNOSIS — IMO0002 Reserved for concepts with insufficient information to code with codable children: Secondary | ICD-10-CM

## 2013-06-23 DIAGNOSIS — S76012A Strain of muscle, fascia and tendon of left hip, initial encounter: Secondary | ICD-10-CM | POA: Insufficient documentation

## 2013-06-23 MED ORDER — HYDROCODONE-ACETAMINOPHEN 5-325 MG PO TABS
1.0000 | ORAL_TABLET | Freq: Four times a day (QID) | ORAL | Status: DC | PRN
Start: 2013-06-23 — End: 2015-11-29

## 2013-06-23 NOTE — Telephone Encounter (Signed)
Patient Information:  Caller Name: Lamiah  Phone: [phone]  Patient: Katie Fischer, Katie Fischer  Gender: Female  DOB: May 18, 1956  Age: 57 Years  PCP: Kelle Darting [facility])  Office Follow Up:  Does the office need to follow up with this patient?: No  Instructions For The Office: N/A  RN Note:  Afebrile. OV follow up from Dr. Abran Cantor diagnosing Left hip flexor injury approximately one month ago and was told to rest. She returned back to the [facility] to resume classes after period of waiting (rest) and the Hip Flexor (left) pain is significant and interrupting sleep. Using Motrin (600mg  Q 4 hours) and alternating ice and heat-nothing helping. She is requesting a muscle relaxer and uses [facility] [address] [phone]. RN/CAN gave home care advise and scheduled an appointment with Dr. Abran Cantor today, 06/23/2013, 16:15.   Symptoms  Reason For Call & Symptoms: OV about one month ago saw Dr. Abran Cantor, torn hip flexor  Reviewed Health History In EMR: Yes  Reviewed Medications In EMR: Yes  Reviewed Allergies In EMR: Yes  Reviewed Surgeries / Procedures: Yes  Date of Onset of Symptoms: 06/15/2013  Treatments Tried: stopped exercise zumba, weight lifting  Treatments Tried Worked: Yes  Guideline(s) Used:  Hip Injury  Disposition Per Guideline:   See Today or Tomorrow in Office  Reason For Disposition Reached:   Injury interferes with work or school  Advice Given:  Reassurance - Bending or Twisting Injury (Strain, Sprain):  Strain and sprain are the medical terms used to describe over-stretching of the muscles and ligaments of the hip. A twisting or bending injury can cause a strain or sprain.  Apply a Cold Pack:  Apply a cold pack or an ice bag (wrapped in a moist towel) to the area for 20 minutes. Repeat in 1 hour, then every 4 hours while awake.  Continue this for the first 48 hours after an injury.  Apply a Cold Pack:  Apply a cold pack or an ice bag (wrapped in a moist towel) to the area for 20  minutes. Repeat in 1 hour, then every 4 hours while awake.  Continue this for the first 48 hours after an injury.  Apply Heat to the Area:  Beginning 48 hours after an injury, apply a warm washcloth or heating pad for 10 minutes three times a day.  This will help increase blood flow and improve healing.  Rest vs. Movement:  Movement is generally more healing in the long term than rest.  Continue normal activities as much as your pain permits.  Avoid running and active sports for 1-2 weeks or until the pain and swelling are gone.  Complete rest should only be used for the first day or two after an injury. If it really hurts too much to walk, you will need to see the doctor.  Expected Course:  Pain, swelling, and bruising usually start to get better 2 to 3 days after an injury.  It may take 2 weeks for pain and tenderness of the injured area to go away.  Call Back If:  Pain becomes severe  Pain does not improve after 3 days  Pain or swelling lasts more than 2 weeks  You become worse.  Patient Will Follow Care Advice:  YES  Appointment Scheduled:  06/23/2013 16:15:00 Appointment Scheduled Provider:  Gershon Crane [facility])

## 2013-06-23 NOTE — Progress Notes (Signed)
Pre visit review using our clinic review tool, if applicable. No additional management support is needed unless otherwise documented below in the visit note. 

## 2013-06-23 NOTE — Progress Notes (Signed)
   Subjective:    Patient ID: Katie Fischer, female    DOB: 1956-06-30, 57 y.o.   MRN: [id number]  HPI She is here for a recurrence of left groin pain. She was seen 6 weeks ago for a hip flexor strain, and she stopped exercising for a month to rest it. It did improve as expected ans she seemed to be back to normal. Then last weekend she went back to the gym and worked out with lunges and weights and she reinjured the area. Now she has more pain than she did last time. Using ice.    Review of Systems  Constitutional: Negative.   Musculoskeletal: Positive for arthralgias.       Objective:   Physical Exam  Constitutional: She appears well-developed and well-nourished.  Musculoskeletal:  Tender over the left hip flexor          Assessment & Plan:  Use Motrin during the day and Vicodin at night for pain. Refer to Orthopedics.

## 2013-06-24 ENCOUNTER — Ambulatory Visit: Payer: Self-pay | Admitting: Family Medicine

## 2015-04-04 LAB — HM MAMMOGRAPHY: HM Mammogram: NORMAL

## 2015-04-14 ENCOUNTER — Encounter: Payer: Self-pay | Admitting: Family Medicine

## 2015-07-11 ENCOUNTER — Telehealth: Payer: Self-pay | Admitting: Family Medicine

## 2015-07-11 NOTE — Telephone Encounter (Signed)
Katie Fischer returned our call to reschedule her appt due to Dr. Sherren Mocha being out of the office on her originally scheduled appointment day in March. His first available CPE appt was on May 9th. She's wondering if she can be worked in sooner than that with Dr. Sherren Mocha for a CPE or if she can see someone else. She was very upset that she wouldn't be able to be seen until May "especially since it's not my fault" were her words. She'd like to switch Providers and see Dr. Yong Channel but she doesn't like his schedule of seeing new patients either being so far out. She'd like a phone call to see if she can be worked in sooner because she's concerned about some things and really wants her CPE before May. Please give her a phone call regarding this.  Pt's ph# [phone] Thank you.

## 2015-07-12 NOTE — Telephone Encounter (Signed)
Spoke with pt to schedule an appt with Dr Yong Channel. She changed her mind and ask if Katie Fischer would call her.

## 2015-07-12 NOTE — Telephone Encounter (Signed)
Please call and schedule this patient as a new transfer patient with Dr Yong Channel.

## 2015-07-12 NOTE — Telephone Encounter (Signed)
Unable to accommodate sooner appointment but would accept transfer at next available

## 2015-07-12 NOTE — Telephone Encounter (Signed)
Patient would like to transfer to Dr Yong Channel is possible

## 2015-07-14 NOTE — Telephone Encounter (Signed)
Spoke with patient.

## 2015-10-03 ENCOUNTER — Other Ambulatory Visit: Payer: Self-pay

## 2015-10-10 ENCOUNTER — Encounter: Payer: Self-pay | Admitting: Family Medicine

## 2015-10-14 ENCOUNTER — Telehealth: Payer: Self-pay | Admitting: Family Medicine

## 2015-10-14 NOTE — Telephone Encounter (Signed)
Ok to establish but due to the volume of new pt's at the new office, there is probably not a way to schedule her much before May

## 2015-10-14 NOTE — Telephone Encounter (Signed)
Pt called asking to trans care to Dr Birdie Riddle, Pt also asking if she could move her CPE appt up. She is to see Dr Sherren Mocha in May. Please advise.

## 2015-10-14 NOTE — Telephone Encounter (Signed)
LM explaining NP scheduling and ask for pt to call back to schedule appt.

## 2015-11-22 ENCOUNTER — Other Ambulatory Visit (INDEPENDENT_AMBULATORY_CARE_PROVIDER_SITE_OTHER): Payer: 59

## 2015-11-22 DIAGNOSIS — Z0000 Encounter for general adult medical examination without abnormal findings: Secondary | ICD-10-CM

## 2015-11-22 LAB — HEPATIC FUNCTION PANEL
ALK PHOS: 57 U/L (ref 39–117)
ALT: 23 U/L (ref 0–35)
AST: 23 U/L (ref 0–37)
Albumin: 4.1 g/dL (ref 3.5–5.2)
BILIRUBIN DIRECT: 0.1 mg/dL (ref 0.0–0.3)
TOTAL PROTEIN: 6.5 g/dL (ref 6.0–8.3)
Total Bilirubin: 0.6 mg/dL (ref 0.2–1.2)

## 2015-11-22 LAB — CBC WITH DIFFERENTIAL/PLATELET
BASOS ABS: 0 10*3/uL (ref 0.0–0.1)
Basophils Relative: 0.6 % (ref 0.0–3.0)
EOS ABS: 0.1 10*3/uL (ref 0.0–0.7)
Eosinophils Relative: 1.1 % (ref 0.0–5.0)
HEMATOCRIT: 41 % (ref 36.0–46.0)
HEMOGLOBIN: 13.5 g/dL (ref 12.0–15.0)
Lymphocytes Relative: 24.8 % (ref 12.0–46.0)
Lymphs Abs: 1.6 10*3/uL (ref 0.7–4.0)
MCHC: 33 g/dL (ref 30.0–36.0)
MCV: 87.9 fl (ref 78.0–100.0)
Monocytes Absolute: 0.6 10*3/uL (ref 0.1–1.0)
Monocytes Relative: 9.3 % (ref 3.0–12.0)
Neutro Abs: 4.1 10*3/uL (ref 1.4–7.7)
Neutrophils Relative %: 64.2 % (ref 43.0–77.0)
PLATELETS: 352 10*3/uL (ref 150.0–400.0)
RBC: 4.66 Mil/uL (ref 3.87–5.11)
RDW: 15.2 % (ref 11.5–15.5)
WBC: 6.4 10*3/uL (ref 4.0–10.5)

## 2015-11-22 LAB — LIPID PANEL
CHOL/HDL RATIO: 3
CHOLESTEROL: 145 mg/dL (ref 0–200)
HDL: 56.4 mg/dL (ref >39.00)
LDL CALC: 74 mg/dL (ref 0–99)
NonHDL: 88.27
Triglycerides: 72 mg/dL (ref 0.0–149.0)
VLDL: 14.4 mg/dL (ref 0.0–40.0)

## 2015-11-22 LAB — POC URINALSYSI DIPSTICK (AUTOMATED)
Bilirubin, UA: NEGATIVE
Blood, UA: NEGATIVE
Glucose, UA: NEGATIVE
KETONES UA: NEGATIVE
LEUKOCYTES UA: NEGATIVE
Nitrite, UA: NEGATIVE
PH UA: 6
PROTEIN UA: NEGATIVE
UROBILINOGEN UA: 0.2

## 2015-11-22 LAB — BASIC METABOLIC PANEL
BUN: 14 mg/dL (ref 6–23)
CALCIUM: 9.4 mg/dL (ref 8.4–10.5)
CO2: 30 mEq/L (ref 19–32)
CREATININE: 0.58 mg/dL (ref 0.40–1.20)
Chloride: 101 mEq/L (ref 96–112)
GFR: 112.53 mL/min (ref >60.00)
Glucose, Bld: 96 mg/dL (ref 70–99)
Potassium: 4.1 mEq/L (ref 3.5–5.1)
Sodium: 137 mEq/L (ref 135–145)

## 2015-11-22 LAB — TSH: TSH: 2.2 u[IU]/mL (ref 0.35–4.50)

## 2015-11-29 ENCOUNTER — Encounter: Payer: Self-pay | Admitting: Family Medicine

## 2015-11-29 ENCOUNTER — Ambulatory Visit (INDEPENDENT_AMBULATORY_CARE_PROVIDER_SITE_OTHER): Payer: 59 | Admitting: Family Medicine

## 2015-11-29 VITALS — BP 120/84 | Temp 98.9°F | Ht 65.0 in | Wt 155.5 lb

## 2015-11-29 DIAGNOSIS — M8580 Other specified disorders of bone density and structure, unspecified site: Secondary | ICD-10-CM | POA: Diagnosis not present

## 2015-11-29 DIAGNOSIS — Z0000 Encounter for general adult medical examination without abnormal findings: Secondary | ICD-10-CM

## 2015-11-29 DIAGNOSIS — R252 Cramp and spasm: Secondary | ICD-10-CM | POA: Diagnosis not present

## 2015-11-29 DIAGNOSIS — G4700 Insomnia, unspecified: Secondary | ICD-10-CM

## 2015-11-29 MED ORDER — LORAZEPAM 1 MG PO TABS: 1.0000 mg | ORAL_TABLET | Freq: Two times a day (BID) | ORAL | Status: DC | PRN

## 2015-11-29 MED ORDER — PRAMIPEXOLE DIHYDROCHLORIDE 0.5 MG PO TABS: 0.5000 mg | ORAL_TABLET | Freq: Three times a day (TID) | ORAL | Status: DC

## 2015-11-29 MED ORDER — TRAZODONE HCL 50 MG PO TABS
ORAL_TABLET | ORAL | Status: DC
Start: 2015-11-29 — End: 2016-12-10

## 2015-11-29 NOTE — Patient Instructions (Addendum)
Trazodone 50 mg..........Marland Kitchen 1 tablet at bedtime  Ativan 1 mg.........Marland Kitchen 1/2-1 tablet daily at bedtime when necessary  Mirapex 0.5.........Marland Kitchen 1 at bedtime for leg cramps  Return in one year sooner if any problems  To increase your bone density I would recommend you continue your exercise............. add a calcium vitamin D supplement.......Marland Kitchen 1 daily

## 2015-11-29 NOTE — Progress Notes (Signed)
Pre visit review using our clinic review tool, if applicable. No additional management support is needed unless otherwise documented below in the visit note. 

## 2015-11-29 NOTE — Progress Notes (Signed)
   Subjective:    Patient ID: Katie Fischer, female    DOB: 04/15/1956, 60 y.o.   MRN: [id number]  HPI  Katie Fischer is a 60 year old married female nonsmoker who comes in today for general physical examination  Her 2 major complaints or restless leg syndrome. She wakes up 2-3 times a week with cramps in her calves. Serum potassium is normal. Also since age 33 she's had sleep dysfunction. She goes to sleep but then wakes up and can't go back to sleep. She's tried all the simple maneuvers. She's cut out her caffeine except one cup of coffee in the morning. She exercises on a regular basis.  She gets routine eye care, dental care, BSE monthly, annual mammography, colonoscopy 8 years ago normal. Last Pap by GYN 2016. She had a bone density in the fall which showed some ostial pia on the borderline of osteoporosis. She has light skin and light eyes and gets lots of sunshine.  She takes no prescription medicine on a regular basis.  Vaccinations up-to-date   Review of Systems  Constitutional: Negative.   HENT: Negative.   Eyes: Negative.   Respiratory: Negative.   Cardiovascular: Negative.   Gastrointestinal: Negative.   Endocrine: Negative.   Genitourinary: Negative.   Musculoskeletal: Negative.   Skin: Negative.   Allergic/Immunologic: Negative.   Neurological: Negative.   Hematological: Negative.   Psychiatric/Behavioral: Negative.        Objective:   Physical Exam  Constitutional: She is oriented to person, place, and time. She appears well-developed and well-nourished.  HENT:  Head: Normocephalic and atraumatic.  Right Ear: External ear normal.  Left Ear: External ear normal.  Nose: Nose normal.  Mouth/Throat: Oropharynx is clear and moist.  Eyes: EOM are normal. Pupils are equal, round, and reactive to light.  Neck: Normal range of motion. Neck supple. No JVD present. No tracheal deviation present. No thyromegaly present.  Cardiovascular: Normal rate, regular rhythm, normal heart  sounds and intact distal pulses.  Exam reveals no gallop and no friction rub.   No murmur heard. No carotid nor aortic bruits peripheral pulses 1+ and symmetrical  Pulmonary/Chest: Effort normal and breath sounds normal. No stridor. No respiratory distress. She has no wheezes. She has no rales. She exhibits no tenderness.  Abdominal: Soft. Bowel sounds are normal. She exhibits no distension and no mass. There is no tenderness. There is no rebound and no guarding.  Genitourinary:  Bilateral breast exam normal  Musculoskeletal: Normal range of motion. She exhibits no edema or tenderness.  Lymphadenopathy:    She has no cervical adenopathy.  Neurological: She is alert and oriented to person, place, and time. She has normal reflexes. No cranial nerve deficit. She exhibits normal muscle tone. Coordination normal.  Skin: Skin is warm and dry. No rash noted. No erythema. No pallor.  Total body skin exam normal  Psychiatric: She has a normal mood and affect. Her behavior is normal. Judgment and thought content normal.  Nursing note and vitals reviewed.         Assessment & Plan:  Healthy female  Restless leg syndrome.......... trial of Mirapex  Sleep dysfunction.............Marland Kitchen begin trazodone 50 mg........ increased by 25 mg every 4 weeks until your sleep function improves  Osteopenia........... calcium vitamin D and exercise......... follow-up bone density in 2 years

## 2016-01-20 ENCOUNTER — Encounter (HOSPITAL_COMMUNITY): Payer: Self-pay | Admitting: *Deleted

## 2016-01-20 ENCOUNTER — Emergency Department (HOSPITAL_COMMUNITY)
Admission: EM | Admit: 2016-01-20 | Discharge: 2016-01-21 | Disposition: A | Discharge status: Home / Self Care | Payer: 59 | Attending: Emergency Medicine | Admitting: Emergency Medicine

## 2016-01-20 DIAGNOSIS — T63001A Toxic effect of unspecified snake venom, accidental (unintentional), initial encounter: Secondary | ICD-10-CM | POA: Diagnosis not present

## 2016-01-20 DIAGNOSIS — Z87891 Personal history of nicotine dependence: Secondary | ICD-10-CM | POA: Diagnosis not present

## 2016-01-20 MED ORDER — SODIUM CHLORIDE 0.9 % IV BOLUS (SEPSIS)
1000.0000 mL | Freq: Once | INTRAVENOUS | Status: AC
  Administered 2016-01-21: 1000 mL via INTRAVENOUS

## 2016-01-20 MED ORDER — MORPHINE SULFATE (PF) 4 MG/ML IV SOLN
4.0000 mg | Freq: Once | INTRAVENOUS | Status: AC
  Administered 2016-01-21: 4 mg via INTRAVENOUS
  Filled 2016-01-20: qty 1

## 2016-01-20 NOTE — ED Provider Notes (Signed)
CSN: [id number]     Arrival date & time 01/20/16  2308 History   First MD Initiated Contact with Patient 01/20/16 2331     Chief Complaint  Patient presents with  . Snake Bite     (Consider location/radiation/quality/duration/timing/severity/associated sxs/prior Treatment) HPI   Patient presents with two snake bites to right foot that occurred around 9:56pm.  States she was taking the trash out, opened the garage door and stepped on a snake, which then bit her twice.  She did not see the snake.  Has had severe pain and burning in her right foot since then that has spread into entire foot.  She was wearing flip flops - the snake bit her directly on the foot, not through her shoe.  Denies myalgias, nausea/vomiting, chills, radiation of pain into upper leg, numbness or weakness of the foot.  Last tetanus 2010.   Past Medical History  Diagnosis Date  . Allergy   . Headache(784.0)   . Eating disorder     as a teenager  . Depression   . PVD (peripheral vascular disease) [facility])    Past Surgical History  Procedure Laterality Date  . Tonsillectomy     Family History  Problem Relation Age of Onset  . Diabetes Mother   . Hypertension Mother   . Hyperlipidemia Mother   . Alcohol abuse Mother   . Thyroid disease Mother   . COPD Father    Social History  Substance Use Topics  . Smoking status: Former Research scientist (life sciences)  . Smokeless tobacco: Never Used  . Alcohol Use: Yes     Comment: occ   OB History    No data available     Review of Systems  Constitutional: Negative for fever and chills.  Respiratory: Negative for shortness of breath.   Cardiovascular: Negative for chest pain.  Allergic/Immunologic: Negative for immunocompromised state.  Neurological: Negative for weakness and numbness.  Hematological: Does not bruise/bleed easily.  Psychiatric/Behavioral: Negative for self-injury.      Allergies  Codeine  Home Medications   Prior to Admission medications   Medication Sig  Start Date End Date Taking? Authorizing Provider  calcium carbonate 200 MG capsule Take 250 mg by mouth daily.     Yes Historical Provider, MD  fexofenadine (ALLEGRA) 180 MG tablet Take 180 mg by mouth daily.   Yes Historical Provider, MD  Multiple Vitamin (MULTIVITAMIN) tablet Take 1 tablet by mouth daily.     Yes Historical Provider, MD  LORazepam (ATIVAN) 1 MG tablet Take 1 tablet (1 mg total) by mouth 2 (two) times daily as needed for anxiety. Patient not taking: Reported on 01/20/2016 11/29/15   Dorena Cookey, MD  pramipexole (MIRAPEX) 0.5 MG tablet Take 1 tablet (0.5 mg total) by mouth 3 (three) times daily. Patient not taking: Reported on 01/20/2016 11/29/15   Dorena Cookey, MD  traZODone (DESYREL) 50 MG tablet 1 tablet at bedtime Patient not taking: Reported on 01/20/2016 11/29/15   Dorena Cookey, MD   BP 124/81 mmHg  Pulse 65  Temp(Src) 97.8 F (36.6 C) (Oral)  Resp 16  Ht 5\' 5"  (1.651 m)  Wt 70.308 kg  BMI 25.79 kg/m2  SpO2 97% Physical Exam  Constitutional: She appears well-developed and well-nourished. No distress.  HENT:  Head: Normocephalic and atraumatic.  Neck: Neck supple.  Cardiovascular: Normal rate.   Pulmonary/Chest: Effort normal.  Musculoskeletal:  Right dorsal foot with edema, ecchymosis, erythema, significant tenderness to palpation.  Sensation intact, pulses intact.  Moves  toes slightly.  Line drawn at 11pm by EMS over anterior ankle, no extension at this time.  (11:53 PM )  Neurological: She is alert.  Skin: She is not diaphoretic.  Psychiatric: She has a normal mood and affect. Her behavior is normal.  Nursing note and vitals reviewed.   ED Course  Procedures (including critical care time) Labs Review Labs Reviewed  BASIC METABOLIC PANEL - Abnormal; Notable for the following:    Glucose, Bld 121 (*)    All other components within normal limits  CBC WITH DIFFERENTIAL/PLATELET - Abnormal; Notable for the following:    Neutro Abs 7.8 (*)    All other  components within normal limits  CBC WITH DIFFERENTIAL/PLATELET  PROTIME-INR  FIBRINOGEN  PROTIME-INR  FIBRINOGEN  CBC WITH DIFFERENTIAL/PLATELET    Imaging Review No results found. I have personally reviewed and evaluated these images and lab results as part of my medical decision-making.   EKG Interpretation None      1:46 AM Pain well controlled, erythema and edema has increased only 1-2 cm since 11pm marking  4:55 AM Discussed pt and labs with poison center.  Okay for discharge home at this point.  Pt reports symptoms are controlled.  Poison control will continue to follow this patient.   Pt reports she can move her foot more than before.    MDM   Final diagnoses:  Snake bite, accidental or unintentional, initial encounter    Afebrile, nontoxic patient with snake bite x 2 (same snake) to the right foot just before 10pm.  Monitored in ED overnight with labs drawn initially and at 6 hours after bite without abnormalities.  Pain controlled in ED.  Clarified - pt states "allergy" to codeine is nausea.  Pt to continue care at home with elevation and pain control.  D/C home with percocet, reglan, PCP follow up PRN, return precautions.  Discussed result, findings, treatment, and follow up  with patient.  Pt given return precautions.  Pt verbalizes understanding and agrees with plan.         Clayton Bibles, PA-C 01/21/16 [id number]  April Palumbo, MD 01/21/16 [phone]

## 2016-01-20 NOTE — ED Notes (Addendum)
Pt to ED by EMS for snake bite to R foot; two bruised areas noted to R inner foot with swelling and redness around toes. Pt was taking the trash out when she believes she stepped on a snake. 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 EMS gave 15mcg of Fentanyl, blood pressure decreased to [id number] systolic. Pain described as itching and burning. 4mg  zofran given as well

## 2016-01-21 LAB — BASIC METABOLIC PANEL
Anion gap: 6 (ref 5–15)
BUN: 11 mg/dL (ref 6–20)
CALCIUM: 9 mg/dL (ref 8.9–10.3)
CO2: 29 mmol/L (ref 22–32)
CREATININE: 0.73 mg/dL (ref 0.44–1.00)
Chloride: 106 mmol/L (ref 101–111)
GFR calc Af Amer: >60 mL/min (ref >60)
GLUCOSE: 121 mg/dL — AB (ref 65–99)
Potassium: 3.6 mmol/L (ref 3.5–5.1)
Sodium: 141 mmol/L (ref 135–145)

## 2016-01-21 LAB — CBC WITH DIFFERENTIAL/PLATELET
BASOS ABS: 0 10*3/uL (ref 0.0–0.1)
BASOS PCT: 0 %
BASOS PCT: 0 %
Basophils Absolute: 0 10*3/uL (ref 0.0–0.1)
EOS ABS: 0.1 10*3/uL (ref 0.0–0.7)
EOS PCT: 0 %
Eosinophils Absolute: 0 10*3/uL (ref 0.0–0.7)
Eosinophils Relative: 1 %
HCT: 40.1 % (ref 36.0–46.0)
HCT: 42.5 % (ref 36.0–46.0)
Hemoglobin: 12.6 g/dL (ref 12.0–15.0)
Hemoglobin: 13.2 g/dL (ref 12.0–15.0)
LYMPHS PCT: 8 %
Lymphocytes Relative: 19 %
Lymphs Abs: 0.8 10*3/uL (ref 0.7–4.0)
Lymphs Abs: 1.7 10*3/uL (ref 0.7–4.0)
MCH: 28.6 pg (ref 26.0–34.0)
MCH: 28.9 pg (ref 26.0–34.0)
MCHC: 31.1 g/dL (ref 30.0–36.0)
MCHC: 31.4 g/dL (ref 30.0–36.0)
MCV: 92 fL (ref 78.0–100.0)
MCV: 92 fL (ref 78.0–100.0)
MONO ABS: 0.6 10*3/uL (ref 0.1–1.0)
MONO ABS: 0.6 10*3/uL (ref 0.1–1.0)
MONOS PCT: 7 %
Monocytes Relative: 6 %
Neutro Abs: 6.5 10*3/uL (ref 1.7–7.7)
Neutro Abs: 7.8 10*3/uL — ABNORMAL HIGH (ref 1.7–7.7)
Neutrophils Relative %: 73 %
Neutrophils Relative %: 86 %
PLATELETS: 282 10*3/uL (ref 150–400)
Platelets: 294 10*3/uL (ref 150–400)
RBC: 4.36 MIL/uL (ref 3.87–5.11)
RBC: 4.62 MIL/uL (ref 3.87–5.11)
RDW: 14.2 % (ref 11.5–15.5)
RDW: 14.3 % (ref 11.5–15.5)
WBC: 9 10*3/uL (ref 4.0–10.5)
WBC: 9.2 10*3/uL (ref 4.0–10.5)

## 2016-01-21 LAB — PROTIME-INR
INR: 1.04 (ref 0.00–1.49)
INR: 1.1 (ref 0.00–1.49)
PROTHROMBIN TIME: 13.8 s (ref 11.6–15.2)
Prothrombin Time: 14.4 seconds (ref 11.6–15.2)

## 2016-01-21 LAB — FIBRINOGEN
FIBRINOGEN: 335 mg/dL (ref 204–475)
FIBRINOGEN: 347 mg/dL (ref 204–475)

## 2016-01-21 MED ORDER — TETANUS-DIPHTH-ACELL PERTUSSIS 5-2.5-18.5 LF-MCG/0.5 IM SUSP
0.5000 mL | Freq: Once | INTRAMUSCULAR | Status: AC
  Administered 2016-01-21: 0.5 mL via INTRAMUSCULAR
  Filled 2016-01-21: qty 0.5

## 2016-01-21 MED ORDER — ONDANSETRON HCL 4 MG/2ML IJ SOLN
4.0000 mg | Freq: Once | INTRAMUSCULAR | Status: AC
  Administered 2016-01-21: 4 mg via INTRAVENOUS
  Filled 2016-01-21: qty 2

## 2016-01-21 MED ORDER — OXYCODONE-ACETAMINOPHEN 5-325 MG PO TABS: 1.0000 | ORAL_TABLET | ORAL | Status: DC | PRN

## 2016-01-21 MED ORDER — ONDANSETRON 4 MG PO TBDP
4.0000 mg | ORAL_TABLET | Freq: Once | ORAL | Status: AC
  Administered 2016-01-21: 4 mg via ORAL
  Filled 2016-01-21: qty 1

## 2016-01-21 MED ORDER — ONDANSETRON 4 MG PO TBDP: 8.0000 mg | ORAL_TABLET | Freq: Once | ORAL | Status: DC

## 2016-01-21 MED ORDER — METOCLOPRAMIDE HCL 10 MG PO TABS: 10.0000 mg | ORAL_TABLET | Freq: Three times a day (TID) | ORAL | Status: DC | PRN

## 2016-01-21 MED ORDER — METOCLOPRAMIDE HCL 5 MG/ML IJ SOLN
10.0000 mg | Freq: Once | INTRAMUSCULAR | Status: AC
  Administered 2016-01-21: 10 mg via INTRAVENOUS
  Filled 2016-01-21: qty 2

## 2016-01-21 MED ORDER — HYDROMORPHONE HCL 1 MG/ML IJ SOLN
1.0000 mg | Freq: Once | INTRAMUSCULAR | Status: AC
  Administered 2016-01-21: 1 mg via INTRAVENOUS
  Filled 2016-01-21: qty 1

## 2016-01-21 MED ORDER — TETANUS-DIPHTH-ACELL PERTUSSIS 5-2.5-18.5 LF-MCG/0.5 IM SUSP
0.5000 mL | Freq: Once | INTRAMUSCULAR | Status: DC
Start: 2016-01-21 — End: 2016-01-21

## 2016-01-21 NOTE — ED Notes (Signed)
Pt c/o nausea; denies need for medication at this time

## 2016-01-21 NOTE — ED Notes (Signed)
Pt with continued nausea at discharge, worse with movement. Pt requesting to be discharged home  To rest. Informed pt to return if unable to tolerated PO fluids at home. Pt and friend expressed understanding

## 2016-01-21 NOTE — ED Notes (Signed)
Circumferential measurements: site 1 (foot) 17 cm; site 2 (ankle) 24cm

## 2016-01-21 NOTE — ED Notes (Signed)
Pt continues to have vomiting; assisted to restroom with use of crutches. Pt reports feeling ready to go home to lie down

## 2016-01-21 NOTE — ED Notes (Signed)
Pt continues to c/o nausea; zofran given

## 2016-01-21 NOTE — ED Notes (Addendum)
Circumferential measurement: site 1 (foot) 23cm; site 2(ankle) 28cm; site 3 (lower calf) 23cm

## 2016-01-21 NOTE — ED Notes (Signed)
Poison control given update on pt.  

## 2016-01-21 NOTE — ED Notes (Signed)
Right Foot Measures 9 Inches

## 2016-01-21 NOTE — Discharge Instructions (Signed)
Read the information below.  Use the prescribed medication as directed.  Please discuss all new medications with your pharmacist.  Do not take additional tylenol while taking the prescribed pain medication to avoid overdose.  Keep your foot elevated as much as possible over the next few days.  You may return to the Emergency Department at any time for worsening condition or any new symptoms that concern you.    If you develop uncontrolled pain, weakness or numbness of the extremity, severe discoloration of the skin, or you are unable to move your foot or walk, return to the ER for a recheck.   If you develop redness, swelling, pus draining from the wound, or fevers greater than 100.4, return to the ER immediately for a recheck.     Snake Bite Snakes may be poisonous (venomous) or nonpoisonous (nonvenomous). A bite from a nonvenomous snake may cause a wound to the skin and possibly to the deeper tissues beneath the skin. A venomous snake will cause a wound and may also inject poison (venom) into the wound.  The effects of snake venom vary depending on the type of snake. In some cases, the effects can be extremely serious or even deadly. A bite from a venomous snake is a medical emergency. Treatment may require the use of antivenom medicine. SYMPTOMS  Symptoms of a snake bite vary depending on the type of snake, whether the snake is venomous, and the severity of the bite. Symptoms for both a venomous or nonvenomous snake may include:   Pain, redness, and swelling at the site of the bite.  Skin discoloration at the site of the bite.   A feeling of nervousness. Symptoms of a venomous snake bite may also include:   Increasing pain and swelling.  Severe anxiety or confusion.  Blood blisters or purple spots in the bite area.   Nausea and vomiting.   Numbness or tingling.   Muscle weakness.   Excessive fatigue or drowsiness.  Excessive sweating.   Difficulty breathing.   Blurred  vision.   Bruising and bleeding at the site of the bite.  Feeling faint or light-headed. In some cases, symptoms do not develop until a few hours after the bite.  DIAGNOSIS  This condition may be diagnosed based on symptoms and a physical exam. Your health care provider will examine the bite area and ask for details about the snake to help determine whether it is venomous. You may also have tests, including blood tests. TREATMENT  Treatment depends on the severity of the bite and whether the snake is venomous.  Treatment for nonvenomous snake bites may involve basic wound care. This often includes cleaning the wound and applying a bandage (dressing). In some cases, antibiotic medicine or a tetanus shot may be given.  Treatment for venomous snake bites may include antivenom medicine in addition to wound care. This medicine needs to be given as soon as possible after the bite. Other treatments may be needed to help control symptoms as they develop. You may need to stay in a hospital so your condition can be monitored. HOME CARE INSTRUCTIONS  Wound Care  Follow instructions from your health care provider about how to take care of your wound. Make sure you:   Wash your hands with soap and water before you change your dressing. If soap and water are not available, use hand sanitizer.  Change your dressing as told by your health care provider.  Keep the bite area clean and dry. Wash the  bite area daily with soap and water or an antiseptic as told by your health care provider.  Check your wound every day for signs of infection. Watch for:   Redness, swelling, or pain that is getting worse.   Fluid, blood, or pus.   If you develop blistering at the site of the bite, protect the blisters from breaking. Do not attempt to open a blister. Medicines  Take or apply over-the-counter and prescription medicines only as told by your health care provider.   If you were prescribed an  antibiotic, take or apply it as told by your health care provider. Do not stop using the antibiotic even if your condition improves. General Instructions  Keep the affected area raised (elevated) above the level of your heart while you are sitting or lying down, if possible.  Keep all follow-up visits as told by your health care provider. This is important. SEEK MEDICAL CARE IF:   You have increased redness, swelling, or pain at the site of your wound.  You have fluid, blood, or pus coming from your wound.  You have a fever. SEEK IMMEDIATE MEDICAL CARE IF:   You develop blood blisters or purple spots in the bite area.   You have nausea or vomiting.   You have numbness or tingling.   You have excessive sweating.   You have trouble breathing.   You have vision problems.   You feel very confused.  You feel faint or light-headed.    This information is not intended to replace advice given to you by your health care provider. Make sure you discuss any questions you have with your health care provider.   Document Released: 07/06/2000 Document Revised: 03/30/2015 Document Reviewed: 11/24/2014 Elsevier Interactive Patient Education [employer].

## 2016-01-21 NOTE — ED Notes (Signed)
Pt vomiting at present. 

## 2016-01-26 ENCOUNTER — Encounter: Payer: Self-pay | Admitting: Family Medicine

## 2016-01-26 ENCOUNTER — Ambulatory Visit (INDEPENDENT_AMBULATORY_CARE_PROVIDER_SITE_OTHER): Payer: 59 | Admitting: Family Medicine

## 2016-01-26 VITALS — BP 120/80 | HR 75 | Temp 98.7°F | Resp 12 | Ht 65.0 in

## 2016-01-26 DIAGNOSIS — T63001D Toxic effect of unspecified snake venom, accidental (unintentional), subsequent encounter: Secondary | ICD-10-CM

## 2016-01-26 DIAGNOSIS — L03115 Cellulitis of right lower limb: Secondary | ICD-10-CM

## 2016-01-26 MED ORDER — SULFAMETHOXAZOLE-TRIMETHOPRIM 800-160 MG PO TABS: 1.0000 | ORAL_TABLET | Freq: Two times a day (BID) | ORAL | Status: AC

## 2016-01-26 NOTE — Patient Instructions (Signed)
A few things to remember from today's visit:   Cellulitis of leg, right - Plan: sulfamethoxazole-trimethoprim (BACTRIM DS,SEPTRA DS) 800-160 MG tablet  Snake bite, accidental or unintentional, subsequent encounter  Continue extremity elevation. I do not think antivenum is necessary, it is improving (according to pictures), and given side effects of antivenum conservative treatment is safer at this time.  Because mild infection around wound area antibiotic recommended.  Monitor for worsening of edema or pain, difficulty breathing, bleeding, or other worrisome sign, in which care ER evaluation is reccommended.  Please be sure medication list is accurate. If a new problem present, please set up appointment sooner than planned today.

## 2016-01-26 NOTE — Progress Notes (Signed)
Pre visit review using our clinic review tool, if applicable. No additional management support is needed unless otherwise documented below in the visit note. 

## 2016-01-26 NOTE — Progress Notes (Signed)
HPI:  ACUTE VISIT:  Chief Complaint  Patient presents with  . Follow-up    post er     Katie Fischer is a 60 y.o. female, who is here today complaining of persistent edema and ecchymosis of right distal leg after snake bite on 01/20/16   ER visit on 01/20/16, she was kept in observation over night, poison controlled consulted, and discharge with instructions. She states that she did not receive antivenum and she is very concerned about this.  She thinks she was bitten by a copperhead snake. Poison controlled has followed by phone on 7/1 and 01/23/2016. She was asked to take picture and e mail them to them.  When asked about changes she states "I do not know" Later during visit while examining extremity, she states that calf is softer, it was like " a wall" when she when to the ER.  Still having pain on area, "very painfull", she has not taken anything for pain.  It was also recommended to elevate LE, she states that she has tried but cannot do it as frequent as recommended.  LE elevation, which helps with edema. Edema is better in the morning when she fist get up and worse at the end of the day. Pain is exacerbated by prolonged standing and walking, alleviated by rest and extremity elevation.   + Pruritus, mainly around bite marks,right  foot.  Lab Results  Component Value Date   WBC 9.2 01/21/2016   HGB 12.6 01/21/2016   HCT 40.1 01/21/2016   MCV 92.0 01/21/2016   PLT 282 01/21/2016   Lab Results  Component Value Date   CREATININE 0.73 01/21/2016   BUN 11 01/21/2016   NA 141 01/21/2016   K 3.6 01/21/2016   CL 106 01/21/2016   CO2 29 01/21/2016   She has not noted fever, chills, decreased appetite,oral lesions, nose/gum bleeding, cough, wheezing, or stridor. Denies severe/frequent headache, visual changes, chest pain, dyspnea, palpitation, claudication,or focal weakness. She has not noted decreased urine output, gross hematuria, or foam in urine.   Denies  abdominal pain, nausea, vomiting, changes in bowel habits, blood in stool or melena.    Review of Systems  Constitutional: Negative for fever, chills, appetite change and fatigue.  HENT: Negative for congestion, ear pain, facial swelling, mouth sores, nosebleeds, sneezing, sore throat, trouble swallowing and voice change.   Eyes: Negative for discharge and redness.  Respiratory: Negative for cough, shortness of breath, wheezing and stridor.   Cardiovascular: Positive for leg swelling. Negative for chest pain and palpitations.  Gastrointestinal: Negative for nausea, vomiting, abdominal pain, diarrhea and blood in stool.  Genitourinary: Negative for dysuria, hematuria and decreased urine volume.  Musculoskeletal: Negative for myalgias, back pain, joint swelling and arthralgias.  Skin: Positive for color change (around affected area:ecchymotic) and rash. Negative for wound.  Neurological: Negative for syncope, weakness, numbness and headaches.  Hematological: Negative for adenopathy. Does not bruise/bleed easily.  Psychiatric/Behavioral: Negative for confusion. The patient is nervous/anxious.       Current Outpatient Prescriptions on File Prior to Visit  Medication Sig Dispense Refill  . calcium carbonate 200 MG capsule Take 250 mg by mouth daily.      . fexofenadine (ALLEGRA) 180 MG tablet Take 180 mg by mouth daily.    Marland Kitchen LORazepam (ATIVAN) 1 MG tablet Take 1 tablet (1 mg total) by mouth 2 (two) times daily as needed for anxiety. 30 tablet 2  . Multiple Vitamin (MULTIVITAMIN) tablet Take 1 tablet by  mouth daily.      . pramipexole (MIRAPEX) 0.5 MG tablet Take 1 tablet (0.5 mg total) by mouth 3 (three) times daily. 100 tablet 3  . traZODone (DESYREL) 50 MG tablet 1 tablet at bedtime 100 tablet 3   No current facility-administered medications on file prior to visit.     Past Medical History  Diagnosis Date  . Allergy   . Headache(784.0)   . Eating disorder     as a teenager  .  Depression   . PVD (peripheral vascular disease) (HCC)    Allergies  Allergen Reactions  . Codeine     REACTION: nausea    Social History   Social History  . Marital Status: Married    Spouse Name: N/A  . Number of Children: N/A  . Years of Education: N/A   Social History Main Topics  . Smoking status: Former Research scientist (life sciences)  . Smokeless tobacco: Never Used  . Alcohol Use: Yes     Comment: occ  . Drug Use: No  . Sexual Activity: Not Asked   Other Topics Concern  . None   Social History Narrative    Filed Vitals:   01/26/16 1530  BP: 120/80  Pulse: 75  Temp: 98.7 F (37.1 C)  Resp: 12   There is no weight on file to calculate BMI.   SpO2 Readings from Last 3 Encounters:  01/26/16 95%  01/21/16 98%  06/23/13 95%      Physical Exam  Nursing note and vitals reviewed. Constitutional: She is oriented to person, place, and time. She appears well-developed. No distress.  HENT:  Head: Atraumatic.  Eyes: Conjunctivae are normal.  Neck: No JVD present. No tracheal deviation present.  Cardiovascular: Normal rate and regular rhythm.   No murmur heard. Pulses:      Dorsalis pedis pulses are 2+ on the right side, and 2+ on the left side.  Good capillary refill.  Respiratory: Effort normal and breath sounds normal. No respiratory distress.  GI: Soft. She exhibits no mass. There is no hepatosplenomegaly. There is no tenderness.  Musculoskeletal: She exhibits edema (RLE.) and tenderness (on areas of calf and foot.).       Right lower leg: She exhibits tenderness (mild.) and edema.  Mild limitation of ROM MTP and ankle right foot.  Lymphadenopathy:    She has no cervical adenopathy.  Neurological: She is alert and oriented to person, place, and time. She has normal strength. No cranial nerve deficit. Coordination normal.  Gait assisted by crutch.  Skin: Skin is warm. No rash noted. There is erythema.     Right LE: medial aspect of first MTP joint with 2 bite marks,  erythematous and tender upon palpation. Macular erythema on MTP joints, local heat,no induration. Ecchymosis on posterior distal thigh and knee, calf, and under medial malleolus. No hematoma palpated.    Psychiatric: Her speech is normal. Her mood appears anxious.  Well groomed, good eye contact.      ASSESSMENT AND PLAN:     [place] was seen today for follow-up.  Diagnoses and all orders for this visit:  Cellulitis of leg, right -     sulfamethoxazole-trimethoprim (BACTRIM DS,SEPTRA DS) 800-160 MG tablet; Take 1 tablet by mouth 2 (two) times daily.  Snake bite, accidental or unintentional, subsequent encounter   She has picture in her phone, which I looked up. Based on picture she has had great improvement of edema and ecchymosis. I do not think antivenum is necessary at this time.  Because local heat and erythema around bite marks, I recommended abx treatment. Continue elevation of LE. Monitor for worsening symptoms and educated about warning signs. Tdap was updated in the ER. OTC topical Cortisone may help with pruritus.  F/U in 1 week, before if needed. She states that she does not need an note for work.        -Ms.Keri Duel advised to return or notify a doctor immediately if symptoms worsen or persist or new concerns arise, she voices understanding.       Osama Coleson G. Martinique, MD  [facility]. [place] office.

## 2016-02-03 ENCOUNTER — Encounter: Payer: Self-pay | Admitting: Family Medicine

## 2016-02-03 ENCOUNTER — Telehealth: Payer: Self-pay

## 2016-02-03 ENCOUNTER — Ambulatory Visit (INDEPENDENT_AMBULATORY_CARE_PROVIDER_SITE_OTHER): Payer: 59 | Admitting: Family Medicine

## 2016-02-03 ENCOUNTER — Ambulatory Visit (HOSPITAL_BASED_OUTPATIENT_CLINIC_OR_DEPARTMENT_OTHER)
Admission: RE | Admit: 2016-02-03 | Discharge: 2016-02-03 | Disposition: A | Discharge status: Home / Self Care | Payer: 59 | Source: Ambulatory Visit | Attending: Family Medicine | Admitting: Family Medicine

## 2016-02-03 DIAGNOSIS — R600 Localized edema: Secondary | ICD-10-CM

## 2016-02-03 DIAGNOSIS — T63001D Toxic effect of unspecified snake venom, accidental (unintentional), subsequent encounter: Secondary | ICD-10-CM | POA: Diagnosis not present

## 2016-02-03 NOTE — Telephone Encounter (Signed)
FYI: Call report on this pt's [place] was NEGATIVE for DVT. Pt was aware to go home and of her results. They let her know that we will follow up with her with any additional recommendations if needed.

## 2016-02-03 NOTE — Progress Notes (Signed)
Pre visit review using our clinic review tool, if applicable. No additional management support is needed unless otherwise documented below in the visit note. 

## 2016-02-03 NOTE — Addendum Note (Signed)
Addended by: Kateri Mc E on: 02/03/2016 10:20 AM   Modules accepted: Orders

## 2016-02-03 NOTE — Patient Instructions (Signed)
A few things to remember from today's visit:   Edema of right lower extremity - Plan: VAS [place] LOWER EXTREMITY VENOUS (DVT)  Snake bite, accidental or unintentional, subsequent encounter - Plan: VAS [place] LOWER EXTREMITY VENOUS (DVT)  He seems improving. Because still Edema and erythema and local heat IS GOING TO BE ARRANGED TO EVALUATE FOR BLOOD CLOT. CONTINUE EXTREMITY ELEVATION above HEART LEVEL.  If any chest pain, difficulty breathing, palpitations, fever, worsening edema or erythema of the seek medical attention.   Please be sure medication list is accurate. If a new problem present, please set up appointment sooner than planned today.

## 2016-02-03 NOTE — Progress Notes (Signed)
HPI:  Chief Complaint  Patient presents with  . Follow-up    snake bite, foot still swollen & hot, back of leg hurts to touch    [name] is a 60 y.o. female, who is here today to follow on recent office visit. She was seen on 01/20/16 to follow on a prior ER visit for snake bite right foot.   ER visit on 01/20/16, she was bitten by a copperhead snake.She was kept in observation over night, poison controlled was consulted, and discharge with instructions.  Poison control was calling for the first few days and had her send pictures to them, she has not received another call since her last office visit.   Overall she is feeling better, no new symptoms reported. She completed Bactrim, no side effects reported.  Concerns today: LE edema, which in general has improved. He is usually worse at the end of the day, now chickenpox. Her shoe. He is hard for her to elevate extremity above heart level because she is having work most of the time. She still have erythema and heat of distal RLE, ecchymosis resolving and no new ones or any bleeding reported  She has not noted fever, chills, decreased appetite, oral lesions, dysphagia, or myalgias. No foam in urine or gross hematuria. No edema on other extremity or elsewhere, no orthopnea or PND.  Denies severe/frequent headache, visual changes, chest pain, dyspnea, palpitation, claudication, focal weakness, or edema.  Denies abdominal pain, nausea, vomiting, changes in bowel habits, blood in stool or melena.  She is planning on leaving town in a few days.   Review of Systems  Constitutional: Negative for fever, chills, diaphoresis, appetite change, fatigue and unexpected weight change.  HENT: Negative for congestion, facial swelling, mouth sores, nosebleeds, sore throat, trouble swallowing and voice change.   Eyes: Negative for pain, discharge, redness and visual disturbance.  Respiratory: Negative for cough, chest tightness,  shortness of breath, wheezing and stridor.   Cardiovascular: Positive for leg swelling (Improving). Negative for chest pain and palpitations.  Gastrointestinal: Negative for nausea, vomiting, abdominal pain and blood in stool.       No changes in bowel habits  Genitourinary: Negative for dysuria, hematuria and decreased urine volume.  Musculoskeletal: Negative for myalgias, back pain, arthralgias, neck pain and neck stiffness.  Skin: Positive for color change. Negative for pallor and wound.  Neurological: Negative for dizziness, tremors, syncope, weakness, numbness and headaches.  Hematological: Negative for adenopathy. Does not bruise/bleed easily.  Psychiatric/Behavioral: Negative for confusion and sleep disturbance. The patient is not nervous/anxious.       Current Outpatient Prescriptions on File Prior to Visit  Medication Sig Dispense Refill  . calcium carbonate 200 MG capsule Take 250 mg by mouth daily.      . fexofenadine (ALLEGRA) 180 MG tablet Take 180 mg by mouth daily.    Marland Kitchen LORazepam (ATIVAN) 1 MG tablet Take 1 tablet (1 mg total) by mouth 2 (two) times daily as needed for anxiety. 30 tablet 2  . Multiple Vitamin (MULTIVITAMIN) tablet Take 1 tablet by mouth daily.      . pramipexole (MIRAPEX) 0.5 MG tablet Take 1 tablet (0.5 mg total) by mouth 3 (three) times daily. 100 tablet 3  . traZODone (DESYREL) 50 MG tablet 1 tablet at bedtime 100 tablet 3   No current facility-administered medications on file prior to visit.     Past Medical History  Diagnosis Date  . Allergy   . Headache(784.0)   .  Eating disorder     as a teenager  . Depression   . PVD (peripheral vascular disease) (HCC)    Allergies  Allergen Reactions  . Codeine     REACTION: nausea    Social History   Social History  . Marital Status: Married    Spouse Name: N/A  . Number of Children: N/A  . Years of Education: N/A   Social History Main Topics  . Smoking status: Former Research scientist (life sciences)  . Smokeless  tobacco: Never Used  . Alcohol Use: Yes     Comment: occ  . Drug Use: No  . Sexual Activity: Not Asked   Other Topics Concern  . None   Social History Narrative    Filed Vitals:   02/03/16 0658  BP: 110/74  Pulse: 64  Temp: 98.2 F (36.8 C)  Resp: 12   Body mass index is 25.15 kg/(m^2).  SpO2 Readings from Last 3 Encounters:  02/03/16 94%  01/26/16 95%  01/21/16 98%     Physical Exam  Nursing note and vitals reviewed. Constitutional: She is oriented to person, place, and time. She appears well-developed. She does not appear ill. No distress.  HENT:  Head: Atraumatic.  Eyes: Conjunctivae are normal.  Cardiovascular: Normal rate and regular rhythm.   No murmur heard. Pulses:      Dorsalis pedis pulses are 2+ on the right side.       Posterior tibial pulses are 2+ on the right side.  Good capillary refill.  Respiratory: Effort normal and breath sounds normal. No respiratory distress.  GI: Soft. She exhibits no mass. There is no tenderness.  Musculoskeletal: She exhibits edema (RLE pitting 2+ edema).       Right lower leg: She exhibits tenderness and edema. She exhibits no laceration.  Tenderness with dorsiflexion of right foot, on inspection calf circumference right is greater than left.  Neurological: She is alert and oriented to person, place, and time. She has normal strength. No cranial nerve deficit. Coordination and gait normal.  Skin: Skin is warm. No petechiae and no rash noted. There is erythema. No cyanosis.     Right LE: Ecchymosis resolving. Erythema on the distal extremity pretibial mainly and dorsum of her right foot, local heat.    Psychiatric: She has a normal mood and affect. Her speech is normal.  Well groomed, good eye contact.      ASSESSMENT AND PLAN:     Rumalda was seen today for follow-up.  Diagnoses and all orders for this visit:  Edema of right lower extremity -     VAS [place] LOWER EXTREMITY VENOUS (DVT); Future  Snake bite,  accidental or unintentional, subsequent encounter -     VAS [place] LOWER EXTREMITY VENOUS (DVT); Future    Overall edema has improved, explained that he might  take a few weeks or even months to completely resolve.  Even though it is better given presence of erythema, calf tenderness + edema, Hx of sneak bite + planning on leaving town I think it is appropriate to evaluate for DVT right lower extremity, so ultrasound is being arranged.  She was clearly instructed about warning signs. Compression stocking may help. Continue lower extremity elevation, ideally above heart level. Follow-up as needed.      -Ms. Katie Fischer advised to return or notify a doctor immediately if symptoms worsen or persist or new concerns arise, she voices understanding.       Betty G. Martinique, MD  [facility]. [place] office.

## 2016-02-07 ENCOUNTER — Other Ambulatory Visit: Payer: Self-pay | Admitting: Family Medicine

## 2016-02-07 MED ORDER — TRIAMCINOLONE ACETONIDE 0.5 % EX CREA: TOPICAL_CREAM | CUTANEOUS | Status: DC

## 2016-11-01 DIAGNOSIS — L821 Other seborrheic keratosis: Secondary | ICD-10-CM | POA: Diagnosis not present

## 2016-11-01 DIAGNOSIS — L814 Other melanin hyperpigmentation: Secondary | ICD-10-CM | POA: Diagnosis not present

## 2016-11-01 DIAGNOSIS — L579 Skin changes due to chronic exposure to nonionizing radiation, unspecified: Secondary | ICD-10-CM | POA: Diagnosis not present

## 2016-12-10 ENCOUNTER — Other Ambulatory Visit (HOSPITAL_COMMUNITY)
Admission: RE | Admit: 2016-12-10 | Discharge: 2016-12-10 | Disposition: A | Discharge status: Home / Self Care | Payer: 59 | Source: Ambulatory Visit | Attending: Family Medicine | Admitting: Family Medicine

## 2016-12-10 ENCOUNTER — Encounter: Payer: Self-pay | Admitting: Family Medicine

## 2016-12-10 ENCOUNTER — Ambulatory Visit (INDEPENDENT_AMBULATORY_CARE_PROVIDER_SITE_OTHER): Payer: 59 | Admitting: Family Medicine

## 2016-12-10 VITALS — BP 110/80 | HR 82 | Resp 12 | Ht 65.0 in | Wt 150.5 lb

## 2016-12-10 DIAGNOSIS — Z124 Encounter for screening for malignant neoplasm of cervix: Secondary | ICD-10-CM

## 2016-12-10 DIAGNOSIS — Z131 Encounter for screening for diabetes mellitus: Secondary | ICD-10-CM | POA: Diagnosis not present

## 2016-12-10 DIAGNOSIS — Z0000 Encounter for general adult medical examination without abnormal findings: Secondary | ICD-10-CM | POA: Insufficient documentation

## 2016-12-10 DIAGNOSIS — Z13220 Encounter for screening for lipoid disorders: Secondary | ICD-10-CM

## 2016-12-10 DIAGNOSIS — Z1159 Encounter for screening for other viral diseases: Secondary | ICD-10-CM

## 2016-12-10 DIAGNOSIS — Z1211 Encounter for screening for malignant neoplasm of colon: Secondary | ICD-10-CM | POA: Diagnosis not present

## 2016-12-10 DIAGNOSIS — G4700 Insomnia, unspecified: Secondary | ICD-10-CM

## 2016-12-10 DIAGNOSIS — R5382 Chronic fatigue, unspecified: Secondary | ICD-10-CM | POA: Diagnosis not present

## 2016-12-10 LAB — LIPID PANEL
CHOL/HDL RATIO: 3
CHOLESTEROL: 157 mg/dL (ref 0–200)
HDL: 53.3 mg/dL (ref >39.00)
LDL Cholesterol: 87 mg/dL (ref 0–99)
NONHDL: 103.31
TRIGLYCERIDES: 84 mg/dL (ref 0.0–149.0)
VLDL: 16.8 mg/dL (ref 0.0–40.0)

## 2016-12-10 LAB — COMPREHENSIVE METABOLIC PANEL
ALT: 22 U/L (ref 0–35)
AST: 26 U/L (ref 0–37)
Albumin: 4.2 g/dL (ref 3.5–5.2)
Alkaline Phosphatase: 67 U/L (ref 39–117)
BUN: 14 mg/dL (ref 6–23)
CALCIUM: 9.4 mg/dL (ref 8.4–10.5)
CHLORIDE: 103 meq/L (ref 96–112)
CO2: 31 meq/L (ref 19–32)
CREATININE: 0.6 mg/dL (ref 0.40–1.20)
GFR: 107.83 mL/min (ref >60.00)
Glucose, Bld: 96 mg/dL (ref 70–99)
POTASSIUM: 4.1 meq/L (ref 3.5–5.1)
Sodium: 140 mEq/L (ref 135–145)
Total Bilirubin: 0.5 mg/dL (ref 0.2–1.2)
Total Protein: 6.3 g/dL (ref 6.0–8.3)

## 2016-12-10 LAB — TSH: TSH: 1.9 u[IU]/mL (ref 0.35–4.50)

## 2016-12-10 MED ORDER — ZOSTER VAC RECOMB ADJUVANTED 50 MCG/0.5ML IM SUSR: 0.5000 mL | INTRAMUSCULAR | 1 refills | Status: DC

## 2016-12-10 MED ORDER — TRAZODONE HCL 50 MG PO TABS: 100.0000 mg | ORAL_TABLET | Freq: Every day | ORAL | 2 refills | Status: DC

## 2016-12-10 NOTE — Progress Notes (Signed)
HPI:   [name] is a 61 y.o. female, who is here today for her routine physical.  Regular exercise 3 or more time per week: At least twice per week. Following a healthy diet: Yes. She lives with her husband.  Chronic medical problems: Insomnia, depression, chronic fatigue. Sleeping about 5 hours, wakes up frequently. Trazodone is not helping.  Depression reported as active, stable overall, denies suicidal thoughts.   Pap smear "a few years" ago. Hx of abnormal pap smears: Denies. Hx of STD's: Denies.   Immunization History  Administered Date(s) Administered  . Influenza,inj,Quad PF,36+ Mos 04/14/2013  . Influenza-Unspecified 06/06/2014  . Td 04/11/2009  . Tdap 01/21/2016    Mammogram: 03/2016 Colonoscopy: 10 years ago. DEXA: 03/2015 normal.  M:11 LMP 43. G:0 Not sexually active.   Hep C screening: She has not had it before,deneis risk factors.  She would like thyroid check. C/O fatigue for many years, stable otherwise.   Review of Systems  Constitutional: Positive for fatigue (no more than usual). Negative for appetite change, fever and unexpected weight change.  HENT: Negative for dental problem, hearing loss, mouth sores, nosebleeds, trouble swallowing and voice change.   Eyes: Negative for pain and visual disturbance.  Respiratory: Negative for cough, shortness of breath and wheezing.   Cardiovascular: Negative for chest pain, palpitations and leg swelling.  Gastrointestinal: Negative for abdominal pain, blood in stool, nausea and vomiting.       No changes in bowel habits.  Endocrine: Negative for cold intolerance, heat intolerance, polydipsia, polyphagia and polyuria.  Genitourinary: Negative for decreased urine volume, dysuria, hematuria, vaginal bleeding and vaginal discharge.       No breast tenderness or nipple discharge.  Musculoskeletal: Negative for gait problem and myalgias.  Skin: Negative for rash.  Allergic/Immunologic: Positive  for environmental allergies.  Neurological: Negative for syncope, weakness and headaches.  Hematological: Negative for adenopathy. Does not bruise/bleed easily.  Psychiatric/Behavioral: Positive for sleep disturbance. Negative for confusion, hallucinations and suicidal ideas. The patient is nervous/anxious.   All other systems reviewed and are negative.     Current Outpatient Prescriptions on File Prior to Visit  Medication Sig Dispense Refill  . calcium carbonate 200 MG capsule Take 250 mg by mouth daily.      . fexofenadine (ALLEGRA) 180 MG tablet Take 180 mg by mouth daily.     No current facility-administered medications on file prior to visit.      Past Medical History:  Diagnosis Date  . Allergy   . Depression   . Eating disorder    as a teenager  . Headache(784.0)   . PVD (peripheral vascular disease) (HCC)     Allergies  Allergen Reactions  . Codeine     REACTION: nausea   Past Surgical History:  Procedure Laterality Date  . TONSILLECTOMY      Family History  Problem Relation Age of Onset  . Diabetes Mother   . Hypertension Mother   . Hyperlipidemia Mother   . Alcohol abuse Mother   . Thyroid disease Mother   . COPD Father     Social History   Social History  . Marital status: Married    Spouse name: N/A  . Number of children: N/A  . Years of education: N/A   Social History Main Topics  . Smoking status: Former Smoker    Quit date: 07/24/1987  . Smokeless tobacco: Never Used  . Alcohol use Yes     Comment: occasinal, weekends  .  Drug use: No  . Sexual activity: Not Currently   Other Topics Concern  . None   Social History Narrative  . None    Vitals:   12/10/16 0758  BP: 110/80  Pulse: 82  Resp: 12   Body mass index is 25.04 kg/m.  O2 sat at RA 96%  Wt Readings from Last 3 Encounters:  12/10/16 150 lb 8 oz (68.3 kg)  02/03/16 151 lb 2 oz (68.5 kg)  01/20/16 155 lb (70.3 kg)    Physical Exam  Nursing note and vitals  reviewed. Constitutional: She is oriented to person, place, and time. She appears well-developed. No distress.  HENT:  Head: Atraumatic.  Right Ear: Hearing, tympanic membrane, external ear and ear canal normal.  Left Ear: Hearing, tympanic membrane, external ear and ear canal normal.  Mouth/Throat: Uvula is midline, oropharynx is clear and moist and mucous membranes are normal.  Eyes: Conjunctivae and EOM are normal. Pupils are equal, round, and reactive to light.  Neck: No tracheal deviation present. No thyroid mass and no thyromegaly present.  Cardiovascular: Normal rate and regular rhythm.   No murmur heard. Pulses:      Dorsalis pedis pulses are 2+ on the right side, and 2+ on the left side.  Respiratory: Effort normal and breath sounds normal. No respiratory distress.  GI: Soft. She exhibits no mass. There is no hepatomegaly. There is no tenderness.  Genitourinary: No breast swelling or tenderness. Uterus is not enlarged and not tender. Cervix exhibits no motion tenderness, no discharge and no friability. Right adnexum displays no mass and no tenderness. Left adnexum displays no mass and no tenderness.  Genitourinary Comments: Breast: No masses, fibrocystic changes outer upper quadrants, L>R. Pap smear collected.  Musculoskeletal: She exhibits edema (Trace pitting edema LE,bilateral.).  No major deformity or signs of synovitis appreciated.  Lymphadenopathy:    She has no cervical adenopathy.    She has no axillary adenopathy.       Right: No supraclavicular adenopathy present.       Left: No supraclavicular adenopathy present.  Neurological: She is alert and oriented to person, place, and time. She has normal strength. No cranial nerve deficit. Coordination and gait normal.  Reflex Scores:      Bicep reflexes are 2+ on the right side and 2+ on the left side.      Patellar reflexes are 2+ on the right side and 2+ on the left side. Skin: Skin is warm. No rash noted. No erythema.    Psychiatric: Her mood appears anxious. Cognition and memory are normal.  Well groomed, good eye contact.      ASSESSMENT AND PLAN:    [name] was seen today for annual exam.  Diagnoses and all orders for this visit:  Lab Results  Component Value Date   CHOL 157 12/10/2016   HDL 53.30 12/10/2016   LDLCALC 87 12/10/2016   TRIG 84.0 12/10/2016   CHOLHDL 3 12/10/2016   Lab Results  Component Value Date   TSH 1.90 12/10/2016   Lab Results  Component Value Date   CREATININE 0.60 12/10/2016   BUN 14 12/10/2016   NA 140 12/10/2016   K 4.1 12/10/2016   CL 103 12/10/2016   CO2 31 12/10/2016   Lab Results  Component Value Date   ALT 22 12/10/2016   AST 26 12/10/2016   ALKPHOS 67 12/10/2016   BILITOT 0.5 12/10/2016     Routine general medical examination at a health care facility  We discussed the importance of regular physical activity and healthy diet for prevention of chronic illness and/or complications. Preventive guidelines reviewed. Vaccination updated, Rx for Shingrix given.  Ca++ and vit D supplementation recommended. Next CPE in 1-3 years.   -     Comprehensive metabolic panel -     Lipid panel  -     Hepatitis C antibody screen -     Ambulatory referral to Gastroenterology -     Zoster Vac Recomb Adjuvanted [facility]) injection; Inject 0.5 mLs into the muscle every 3 (three) months. -     PAP [[place]]  Diabetes mellitus screening -     Comprehensive metabolic panel  Lipid screening -     Lipid panel  Encounter for hepatitis C screening test for low risk patient -     Hepatitis C antibody screen  Cervical cancer screening -     PAP [[place]]  Colon cancer screening -     Ambulatory referral to Gastroenterology  Chronic fatigue  We dicussed possible causes, in her case depression and insomnia can be aggravating factors. Reported as stable for many years. She is not interested in treatment for depression.  -      TSH  Insomnia, unspecified type  Options discussed, Doxepin Vs increasing dose of Trazodone from 50 mg to 100 mg. She would like to try the latter one, increasing Trazodone. Some side effects discussed. Good sleep hygiene. F/U in 2-3 months.  -     traZODone (DESYREL) 50 MG tablet; Take 2 tablets (100 mg total) by mouth at bedtime. 1 tablet at bedtime    Return in 3 months (on 03/12/2017) for insomnia.     Betty G. Martinique, MD  [facility]. [place] office.

## 2016-12-10 NOTE — Patient Instructions (Signed)
A few things to remember from today's visit:   Routine general medical examination at a health care facility - Plan: Comprehensive metabolic panel, Lipid panel, Cytology - PAP ([place]), Hepatitis C antibody screen, Ambulatory referral to Gastroenterology, Zoster Vac Recomb Adjuvanted [facility]) injection  Diabetes mellitus screening - Plan: Comprehensive metabolic panel  Lipid screening - Plan: Lipid panel  Encounter for hepatitis C screening test for low risk patient - Plan: Hepatitis C antibody screen  Cervical cancer screening - Plan: Cytology - PAP ([place])  Colon cancer screening - Plan: Ambulatory referral to Gastroenterology  Chronic fatigue - Plan: TSH     At least 150 minutes of moderate exercise per week, daily brisk walking for 15-30 min is a good exercise option. Healthy diet low in saturated (animal) fats and sweets and consisting of fresh fruits and vegetables, lean meats such as fish and white chicken and whole grains.   - Vaccines:  Tdap vaccine every 10 years.  Shingles vaccine recommended at age 80, could be given after 62 years of age but not sure about insurance coverage.  Pneumonia vaccines:  Prevnar 13 at 65 and Pneumovax at 93.  Screening recommendations for low/normal risk women:  Screening for diabetes at age 66-45 and every 3 years.  Cervical cancer prevention:  -HPV vaccination between 23-59 years old. -Pap smear starts at 62 years of age and continues periodically until 62 years old in low risk women. Pap smear every 3 years between 77 and 73 years old. Pap smear every 3 years between women 53 and older if pap smear negative and HPV screening negative.   -Breast cancer: Mammogram: There is disagreement between experts about when to start screening in low risk asymptomatic female but recent recommendations are to start screening at 54 and not later than 62 years old , every 1-2 years and after 62 yo q 2 years. Screening is recommended until  62 years old but some women can continue screening depending of healthy issues.   Colon cancer screening: starts at 62 years old until 62 years old.  Cholesterol disorder screening at age 48 and every 3 years.  Also recommended:  1. Dental visit- Brush and floss your teeth twice daily; visit your dentist twice a year. 2. Eye doctor- Get an eye exam at least every 2 years. 3. Helmet use- Always wear a helmet when riding a bicycle, motorcycle, rollerblading or skateboarding. 4. Safe sex- If you may be exposed to sexually transmitted infections, use a condom. 5. Seat belts- Seat belts can save your live; always wear one. 6. Smoke/Carbon Monoxide detectors- These detectors need to be installed on the appropriate level of your home. Replace batteries at least once a year. 7. Skin cancer- When out in the sun please cover up and use sunscreen 15 SPF or higher. 8. Violence- If anyone is threatening or hurting you, please tell your healthcare provider.  9. Drink alcohol in moderation- Limit alcohol intake to one drink or less per day. Never drink and drive.  Please be sure medication list is accurate. If a new problem present, please set up appointment sooner than planned today.

## 2016-12-11 LAB — CYTOLOGY - PAP
Diagnosis: NEGATIVE
HPV: NOT DETECTED

## 2016-12-11 LAB — HEPATITIS C ANTIBODY: HCV AB: NEGATIVE

## 2016-12-12 ENCOUNTER — Other Ambulatory Visit: Payer: Self-pay | Admitting: Family Medicine

## 2016-12-13 ENCOUNTER — Encounter: Payer: Self-pay | Admitting: Family Medicine

## 2016-12-21 DIAGNOSIS — H2513 Age-related nuclear cataract, bilateral: Secondary | ICD-10-CM | POA: Diagnosis not present

## 2016-12-21 DIAGNOSIS — H25013 Cortical age-related cataract, bilateral: Secondary | ICD-10-CM | POA: Diagnosis not present

## 2016-12-21 DIAGNOSIS — H35363 Drusen (degenerative) of macula, bilateral: Secondary | ICD-10-CM | POA: Diagnosis not present

## 2016-12-31 ENCOUNTER — Encounter: Payer: Self-pay | Admitting: Gastroenterology

## 2017-02-24 NOTE — Progress Notes (Signed)
HPI:   [name] is a 61 y.o. female, who is here today to follow on recent OV.   She was seen on 12/10/16 for her routine CPE. Insomnia, last OV Trazodone was increased from 50 mg to 100 mg.  Hx of fatigue and depression, she was not interested in treatment for the latter one.  She is sleeping about 6 hours (4 hours before) but interrupted, waking up "several" times per night, sometimes to use the bathroom, and has difficulty falling back to sleep. States that once she wakes up, she keeps thinking about things she needs to do ta work next day.  She is not aware of sleep apnea.  She has not tried other medications for sleep in the past. OTC sleep aids did not help.  + Fatigue.   Review of Systems  Constitutional: Positive for fatigue. Negative for activity change, appetite change and unexpected weight change.  HENT: Negative for congestion, sore throat and trouble swallowing.   Respiratory: Negative for apnea and shortness of breath.   Cardiovascular: Negative for palpitations and leg swelling.  Gastrointestinal: Negative for abdominal pain, nausea and vomiting.       No changes in bowel habits.  Endocrine: Negative for cold intolerance and heat intolerance.  Neurological: Negative for syncope, weakness and headaches.  Psychiatric/Behavioral: Positive for sleep disturbance. Negative for confusion and hallucinations. The patient is nervous/anxious.       Current Outpatient Prescriptions on File Prior to Visit  Medication Sig Dispense Refill  . calcium carbonate 200 MG capsule Take 250 mg by mouth daily.      . fexofenadine (ALLEGRA) 180 MG tablet Take 180 mg by mouth daily.     No current facility-administered medications on file prior to visit.      Past Medical History:  Diagnosis Date  . Allergy   . Depression   . Eating disorder    as a teenager  . Headache(784.0)   . PVD (peripheral vascular disease) (HCC)    Allergies  Allergen Reactions  .  Codeine     REACTION: nausea    Social History   Social History  . Marital status: Married    Spouse name: N/A  . Number of children: N/A  . Years of education: N/A   Social History Main Topics  . Smoking status: Former Smoker    Quit date: 07/24/1987  . Smokeless tobacco: Never Used  . Alcohol use Yes     Comment: occasinal, weekends  . Drug use: No  . Sexual activity: Not Currently   Other Topics Concern  . None   Social History Narrative  . None    Vitals:   02/25/17 0726  BP: 122/80  Pulse: 68  Resp: 12   Body mass index is 25.31 kg/m.   Physical Exam  Nursing note and vitals reviewed. Constitutional: She is oriented to person, place, and time. She appears well-developed and well-nourished. No distress.  HENT:  Head: Normocephalic.  Mouth/Throat: Oropharynx is clear and moist and mucous membranes are normal.  Eyes: Pupils are equal, round, and reactive to light. Conjunctivae are normal.  Cardiovascular: Normal rate and regular rhythm.   No murmur heard. Respiratory: Effort normal and breath sounds normal. No respiratory distress.  Musculoskeletal: She exhibits no edema.  Neurological: She is alert and oriented to person, place, and time. She has normal strength. Gait normal.  Skin: Skin is warm. No erythema.  Psychiatric: Her speech is normal. Her mood appears anxious. Cognition  and memory are normal.  Well groomed, good eye contact.    ASSESSMENT AND PLAN:   Katie Fischer was seen today for medication follow up.  Diagnoses and all orders for this visit:  Insomnia, unspecified type -     doxepin (SINEQUAN) 10 MG/ML solution; Take 1 mL (10 mg total) by mouth at bedtime.   We discussed a few treatment options, states that she is not interested in meds that have a risk of addiction/dependency. She agrees with trying Doxepin, which she can start with 3 mg and titrate to 6 mg and 10 mg as tolerated. We dicussed some side effects. She will wean off  Trazodone. Good sleep hygiene. She was instructed to let me know about tolerance of new medication and if it is helping.  We will schedule f/u appt depending of response to treatment.   Katie Fischer G. Martinique, MD  [facility]. [place] office.

## 2017-02-25 ENCOUNTER — Encounter: Payer: Self-pay | Admitting: Family Medicine

## 2017-02-25 ENCOUNTER — Ambulatory Visit (INDEPENDENT_AMBULATORY_CARE_PROVIDER_SITE_OTHER): Payer: 59 | Admitting: Family Medicine

## 2017-02-25 VITALS — BP 122/80 | HR 68 | Resp 12 | Ht 65.0 in | Wt 152.1 lb

## 2017-02-25 DIAGNOSIS — G4700 Insomnia, unspecified: Secondary | ICD-10-CM | POA: Diagnosis not present

## 2017-02-25 MED ORDER — DOXEPIN HCL 10 MG/ML PO CONC: 10.0000 mg | Freq: Every day | ORAL | 1 refills | Status: DC

## 2017-02-25 NOTE — Patient Instructions (Signed)
A few things to remember from today's visit:   Insomnia, unspecified type - Plan: doxepin (SINEQUAN) 10 MG/ML solution  Trazodone decreased to 50 mg daily x 5 days then q 2 days x 5 days then 25 mg q 2 days for 7 days and stop.  Start Doxepin 3 mg then titrate up to 6 mg and 10 mg as tolerated.  In about 4 weeks please let me know through My Chart or by calling the office about tolerance of new medication.   Please be sure medication list is accurate. If a new problem present, please set up appointment sooner than planned today.

## 2017-03-01 ENCOUNTER — Ambulatory Visit (AMBULATORY_SURGERY_CENTER): Payer: Self-pay | Admitting: *Deleted

## 2017-03-01 VITALS — Ht 66.0 in | Wt 152.0 lb

## 2017-03-01 DIAGNOSIS — Z1211 Encounter for screening for malignant neoplasm of colon: Secondary | ICD-10-CM

## 2017-03-01 DIAGNOSIS — Z23 Encounter for immunization: Secondary | ICD-10-CM | POA: Diagnosis not present

## 2017-03-01 MED ORDER — NA SULFATE-K SULFATE-MG SULF 17.5-3.13-1.6 GM/177ML PO SOLN: ORAL | 0 refills | Status: DC

## 2017-03-01 NOTE — Progress Notes (Signed)
Patient denies any allergies to eggs or soy. Patient denies any problems with anesthesia/sedation. Patient denies any oxygen use at home and does not take any diet/weight loss medications. EMMI education assisgned to patient on colonoscopy, this was explained and instructions given to patient. 

## 2017-03-04 ENCOUNTER — Encounter: Payer: Self-pay | Admitting: Gastroenterology

## 2017-03-11 ENCOUNTER — Other Ambulatory Visit: Payer: Self-pay | Admitting: Family Medicine

## 2017-03-11 DIAGNOSIS — G4700 Insomnia, unspecified: Secondary | ICD-10-CM

## 2017-03-11 NOTE — Telephone Encounter (Signed)
Trazodone was weaned off (noted on A/P last OV). She is now on Doxepin.  Thanks, BJ

## 2017-03-15 ENCOUNTER — Ambulatory Visit (AMBULATORY_SURGERY_CENTER): Payer: 59 | Admitting: Gastroenterology

## 2017-03-15 ENCOUNTER — Encounter: Payer: Self-pay | Admitting: Gastroenterology

## 2017-03-15 VITALS — BP 131/72 | HR 57 | Temp 97.5°F | Resp 11 | Ht 62.0 in | Wt 152.0 lb

## 2017-03-15 DIAGNOSIS — D123 Benign neoplasm of transverse colon: Secondary | ICD-10-CM

## 2017-03-15 DIAGNOSIS — K6389 Other specified diseases of intestine: Secondary | ICD-10-CM | POA: Diagnosis not present

## 2017-03-15 DIAGNOSIS — Z1211 Encounter for screening for malignant neoplasm of colon: Secondary | ICD-10-CM

## 2017-03-15 DIAGNOSIS — K635 Polyp of colon: Secondary | ICD-10-CM | POA: Diagnosis not present

## 2017-03-15 DIAGNOSIS — Z1212 Encounter for screening for malignant neoplasm of rectum: Secondary | ICD-10-CM

## 2017-03-15 MED ORDER — SODIUM CHLORIDE 0.9 % IV SOLN: 500.0000 mL | INTRAVENOUS | Status: DC

## 2017-03-15 NOTE — Op Note (Signed)
[place] Patient Name: Katie Fischer Procedure Date: 03/15/2017 8:56 AM MRN: [id number] Endoscopist: Remo Lipps P. Armbruster MD, MD Age: 62 Referring MD:  Date of Birth: Jun 20, 1956 Gender: Female Account #: [id number] Procedure:                Colonoscopy Indications:              Screening for colorectal malignant neoplasm Medicines:                Monitored Anesthesia Care Procedure:                Pre-Anesthesia Assessment:                           - Prior to the procedure, a History and Physical                            was performed, and patient medications and                            allergies were reviewed. The patient's tolerance of                            previous anesthesia was also reviewed. The risks                            and benefits of the procedure and the sedation                            options and risks were discussed with the patient.                            All questions were answered, and informed consent                            was obtained. Prior Anticoagulants: The patient has                            taken no previous anticoagulant or antiplatelet                            agents. ASA Grade Assessment: II - A patient with                            mild systemic disease. After reviewing the risks                            and benefits, the patient was deemed in                            satisfactory condition to undergo the procedure.                           After obtaining informed consent, the colonoscope  was passed under direct vision. Throughout the                            procedure, the patient's blood pressure, pulse, and                            oxygen saturations were monitored continuously. The                            Colonoscope was introduced through the anus and                            advanced to the the cecum, identified by                            appendiceal orifice  and ileocecal valve. The                            colonoscopy was technically difficult and complex                            due to significant looping. The patient tolerated                            the procedure well. The quality of the bowel                            preparation was good. The ileocecal valve,                            appendiceal orifice, and rectum were photographed. Scope In: 8:59:10 AM Scope Out: 9:31:09 AM Scope Withdrawal Time: 0 hours 25 minutes 47 seconds  Total Procedure Duration: 0 hours 31 minutes 59 seconds  Findings:                 The perianal and digital rectal examinations were                            normal.                           The colon was significantly tortuous which                            prolonged this procedure.                           A 6 mm polyp was found in the transverse colon. The                            polyp was flat. The polyp was removed with a cold                            snare. Resection and retrieval were complete.  The exam was otherwise without abnormality on                            direct and retroflexion views. Complications:            No immediate complications. Estimated blood loss:                            Minimal. Estimated Blood Loss:     Estimated blood loss was minimal. Impression:               - Tortuous colon.                           - One 6 mm polyp in the transverse colon, removed                            with a cold snare. Resected and retrieved.                           - The examination was otherwise normal on direct                            and retroflexion views. Recommendation:           - Patient has a contact number available for                            emergencies. The signs and symptoms of potential                            delayed complications were discussed with the                            patient. Return to normal activities  tomorrow.                            Written discharge instructions were provided to the                            patient.                           - Resume previous diet.                           - Continue present medications.                           - Await pathology results.                           - Repeat colonoscopy is recommended for                            surveillance. The colonoscopy date will be  determined after pathology results from today's                            exam become available for review. Remo Lipps P. Armbruster MD, MD 03/15/2017 9:34:53 AM This report has been signed electronically.

## 2017-03-15 NOTE — Progress Notes (Signed)
Called to room to assist during endoscopic procedure.  Patient ID and intended procedure confirmed with present staff. Received instructions for my participation in the procedure from the performing physician.  

## 2017-03-15 NOTE — Patient Instructions (Signed)
   Information on polyps given to you today  Await pathology results   YOU HAD AN ENDOSCOPIC PROCEDURE TODAY AT [facility]:   Refer to the procedure report that was given to you for any specific questions about what was found during the examination.  If the procedure report does not answer your questions, please call your gastroenterologist to clarify.  If you requested that your care partner not be given the details of your procedure findings, then the procedure report has been included in a sealed envelope for you to review at your convenience later.  YOU SHOULD EXPECT: Some feelings of bloating in the abdomen. Passage of more gas than usual.  Walking can help get rid of the air that was put into your GI tract during the procedure and reduce the bloating. If you had a lower endoscopy (such as a colonoscopy or flexible sigmoidoscopy) you may notice spotting of blood in your stool or on the toilet paper. If you underwent a bowel prep for your procedure, you may not have a normal bowel movement for a few days.  Please Note:  You might notice some irritation and congestion in your nose or some drainage.  This is from the oxygen used during your procedure.  There is no need for concern and it should clear up in a day or so.  SYMPTOMS TO REPORT IMMEDIATELY:   Following lower endoscopy (colonoscopy or flexible sigmoidoscopy):  Excessive amounts of blood in the stool  Significant tenderness or worsening of abdominal pains  Swelling of the abdomen that is new, acute  Fever of 100F or higher    For urgent or emergent issues, a gastroenterologist can be reached at any hour by calling [phone].   DIET:  We do recommend a small meal at first, but then you may proceed to your regular diet.  Drink plenty of fluids but you should avoid alcoholic beverages for 24 hours.  ACTIVITY:  You should plan to take it easy for the rest of today and you should NOT DRIVE or use heavy  machinery until tomorrow (because of the sedation medicines used during the test).    FOLLOW UP: Our staff will call the number listed on your records the next business day following your procedure to check on you and address any questions or concerns that you may have regarding the information given to you following your procedure. If we do not reach you, we will leave a message.  However, if you are feeling well and you are not experiencing any problems, there is no need to return our call.  We will assume that you have returned to your regular daily activities without incident.  If any biopsies were taken you will be contacted by phone or by letter within the next 1-3 weeks.  Please call us at [phone] if you have not heard about the biopsies in 3 weeks.    SIGNATURES/CONFIDENTIALITY: You and/or your care partner have signed paperwork which will be entered into your electronic medical record.  These signatures attest to the fact that that the information above on your After Visit Summary has been reviewed and is understood.  Full responsibility of the confidentiality of this discharge information lies with you and/or your care-partner.

## 2017-03-15 NOTE — Progress Notes (Signed)
To PACU VSS. Report to [email or web address] 

## 2017-03-15 NOTE — Progress Notes (Signed)
No changes in medical or surgical hx since PV per pt 

## 2017-03-17 ENCOUNTER — Encounter: Payer: Self-pay | Admitting: Family Medicine

## 2017-03-18 ENCOUNTER — Telehealth: Payer: Self-pay | Admitting: *Deleted

## 2017-03-18 DIAGNOSIS — D692 Other nonthrombocytopenic purpura: Secondary | ICD-10-CM | POA: Diagnosis not present

## 2017-03-18 DIAGNOSIS — L579 Skin changes due to chronic exposure to nonionizing radiation, unspecified: Secondary | ICD-10-CM | POA: Diagnosis not present

## 2017-03-18 NOTE — Telephone Encounter (Signed)
  Follow up Call-  Call back number 03/15/2017  Post procedure Call Back phone  # [phone]  Permission to leave phone message Yes  Some recent data might be hidden     Patient questions:  Do you have a fever, pain , or abdominal swelling? No. Pain Score  0 *  Have you tolerated food without any problems? Yes.    Have you been able to return to your normal activities? Yes.    Do you have any questions about your discharge instructions: Diet   No. Medications  No. Follow up visit  No.  Do you have questions or concerns about your Care? No.  Actions: * If pain score is 4 or above: No action needed, pain <4.

## 2017-03-19 ENCOUNTER — Other Ambulatory Visit: Payer: Self-pay | Admitting: Family Medicine

## 2017-03-19 DIAGNOSIS — G4700 Insomnia, unspecified: Secondary | ICD-10-CM

## 2017-03-19 MED ORDER — DOXEPIN HCL 10 MG PO CAPS: 10.0000 mg | ORAL_CAPSULE | Freq: Every evening | ORAL | 0 refills | Status: DC | PRN

## 2017-03-20 ENCOUNTER — Encounter: Payer: Self-pay | Admitting: Gastroenterology

## 2017-04-11 ENCOUNTER — Encounter: Payer: Self-pay | Admitting: Family Medicine

## 2017-04-15 ENCOUNTER — Encounter: Payer: Self-pay | Admitting: Family Medicine

## 2017-04-16 ENCOUNTER — Other Ambulatory Visit: Payer: Self-pay | Admitting: Family Medicine

## 2017-04-16 DIAGNOSIS — G4700 Insomnia, unspecified: Secondary | ICD-10-CM

## 2017-04-18 ENCOUNTER — Other Ambulatory Visit: Payer: Self-pay | Admitting: Family Medicine

## 2017-04-18 DIAGNOSIS — G4700 Insomnia, unspecified: Secondary | ICD-10-CM

## 2017-04-18 MED ORDER — DOXEPIN HCL 10 MG PO CAPS: 10.0000 mg | ORAL_CAPSULE | Freq: Every evening | ORAL | 4 refills | Status: DC | PRN

## 2017-05-16 DIAGNOSIS — Z1231 Encounter for screening mammogram for malignant neoplasm of breast: Secondary | ICD-10-CM | POA: Diagnosis not present

## 2017-05-16 LAB — HM MAMMOGRAPHY

## 2017-05-20 ENCOUNTER — Encounter: Payer: Self-pay | Admitting: Family Medicine

## 2017-06-17 ENCOUNTER — Other Ambulatory Visit: Payer: Self-pay | Admitting: Family Medicine

## 2017-06-17 ENCOUNTER — Telehealth: Payer: Self-pay | Admitting: Family Medicine

## 2017-06-17 DIAGNOSIS — G4700 Insomnia, unspecified: Secondary | ICD-10-CM

## 2017-06-17 MED ORDER — DOXEPIN HCL 10 MG PO CAPS: 20.0000 mg | ORAL_CAPSULE | Freq: Every evening | ORAL | 0 refills | Status: DC | PRN

## 2017-06-17 NOTE — Telephone Encounter (Signed)
Pt  Requesting a  Partial  Prescription  Sent in for  Doxepin

## 2017-06-17 NOTE — Telephone Encounter (Unsigned)
Copied from [place]. Topic: Quick Communication - See Telephone Encounter >> Jun 17, 2017  3:09 PM Boyd Kerbs wrote: CRM for notification. See Telephone encounter for:   patient called said per insurance co. The prescription has to go to [facility]. For Doxepin.  Patient is out of medication and was asking if this could be done right away and if possible can get partial prescription called into [facility] to last until mail order comes in.   06/17/17.

## 2017-06-17 NOTE — Telephone Encounter (Signed)
Rx for Doxepin sent to local pharmacy and [facility].  Thanks, BJ

## 2017-06-18 NOTE — Telephone Encounter (Signed)
Left message informing patient that Rx's were sent to both pharmacies per request.

## 2017-07-30 IMAGING — US US EXTREM LOW VENOUS*R*
1 series · 13 of 24 positions shown · non-contrast
Comparison: None.

CLINICAL DATA: Recent snake bite. Edema/ swelling and ecchymosis in
the right lower extremity



[Series 1: us extrem low venous*right* · 0.05mm/px · 13 of 30 slices shown]
[im 1/30]
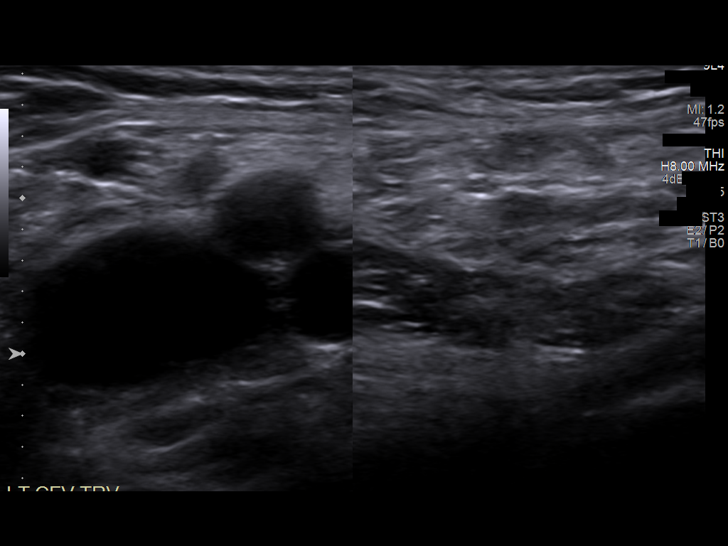
[im 3/30]
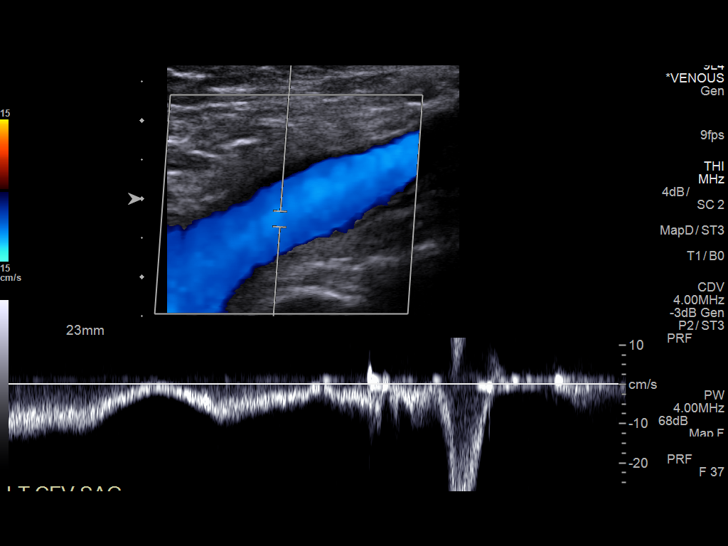
[im 6/30]
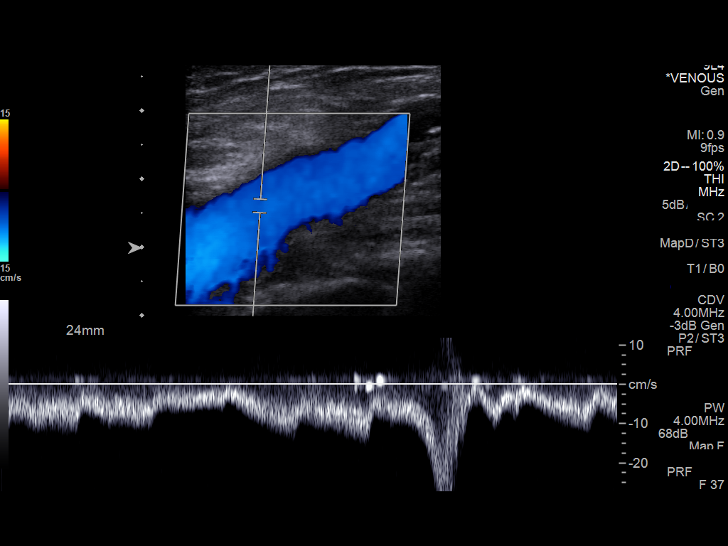
[im 8/30]
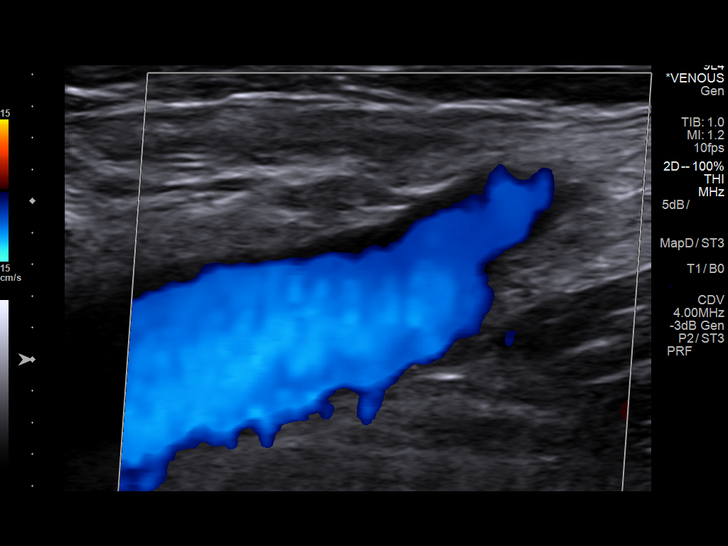
[im 11/30]
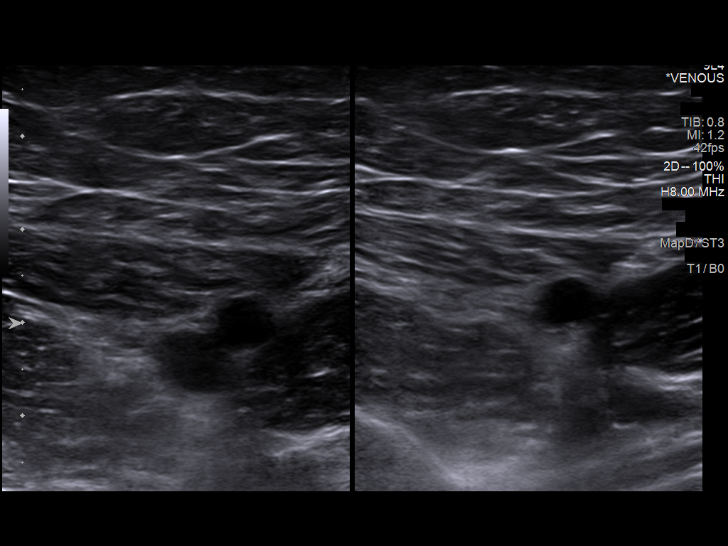
[im 13/30]
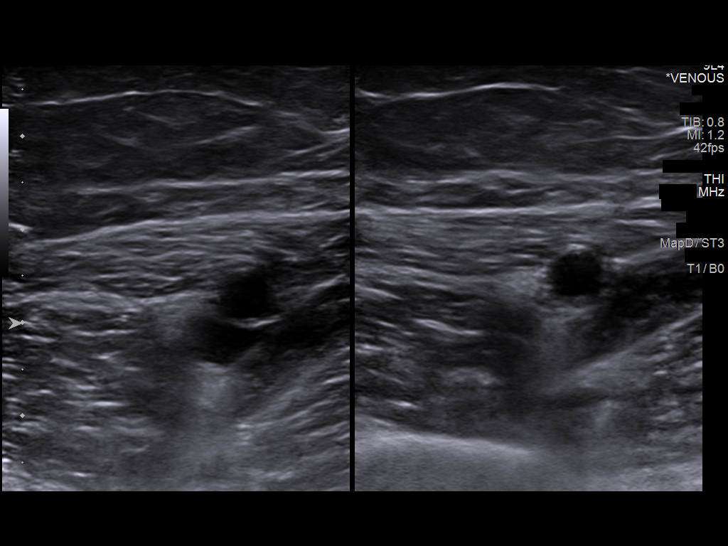
[im 16/30]
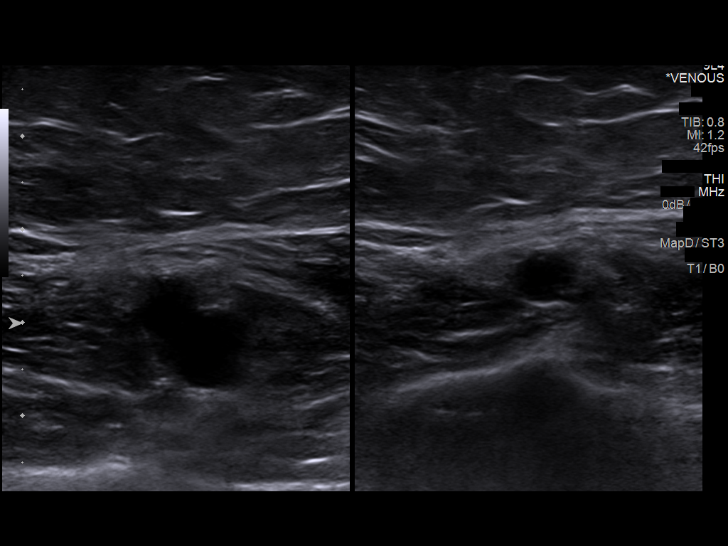
[im 17/30]
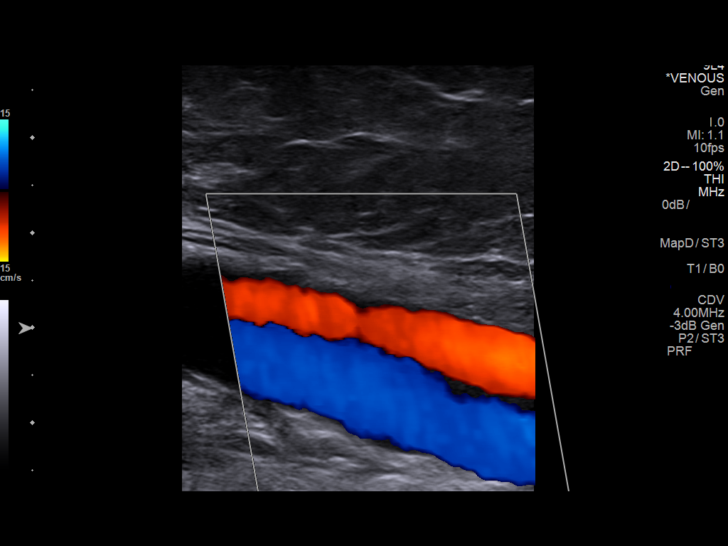
[im 19/30]
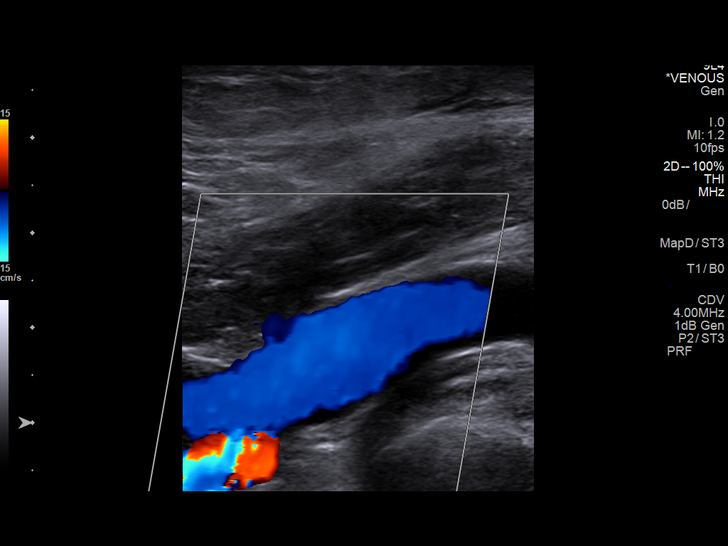
[im 22/30]
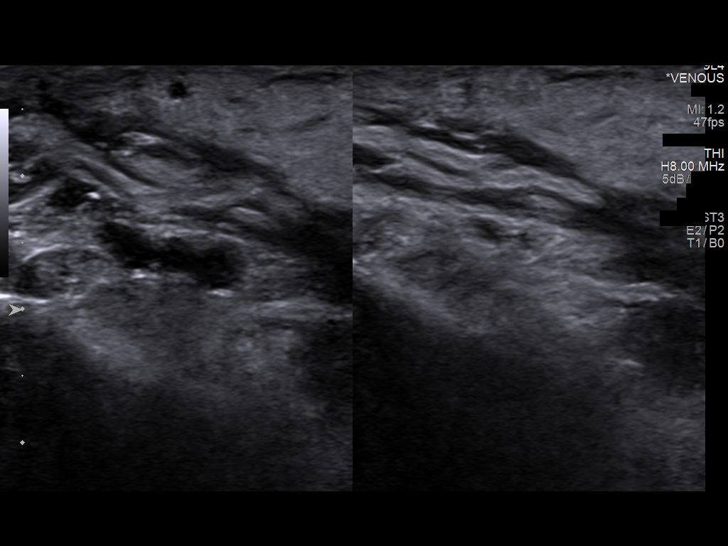
[im 24/30]
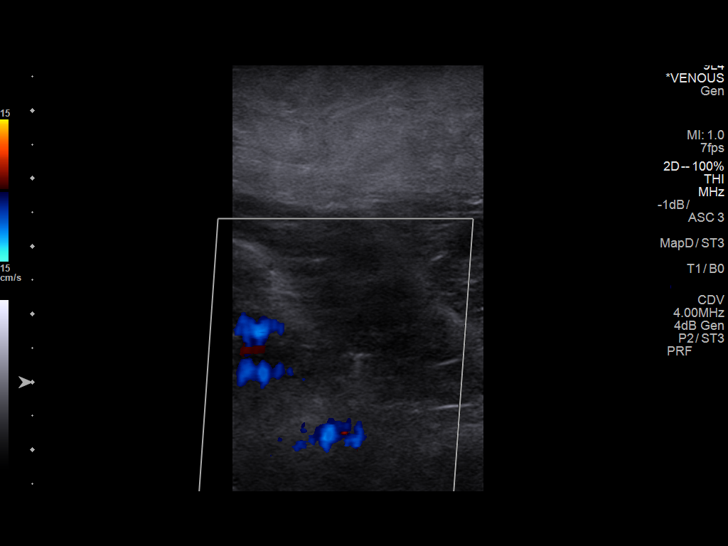
[im 27/30]
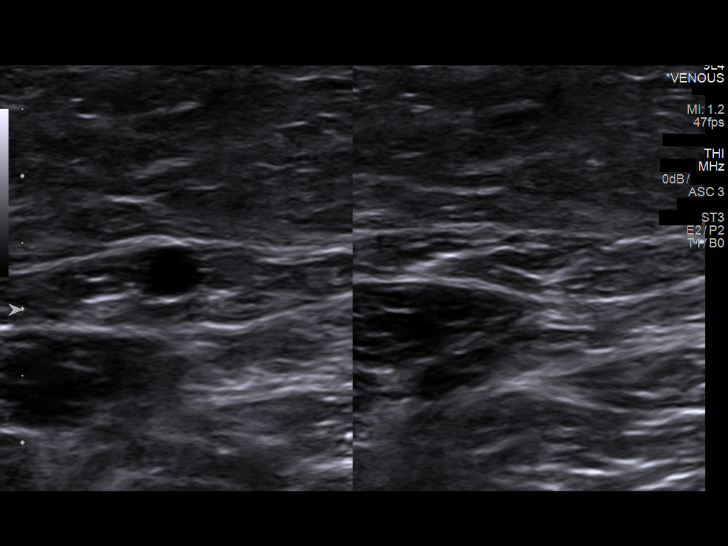
[im 30/30]
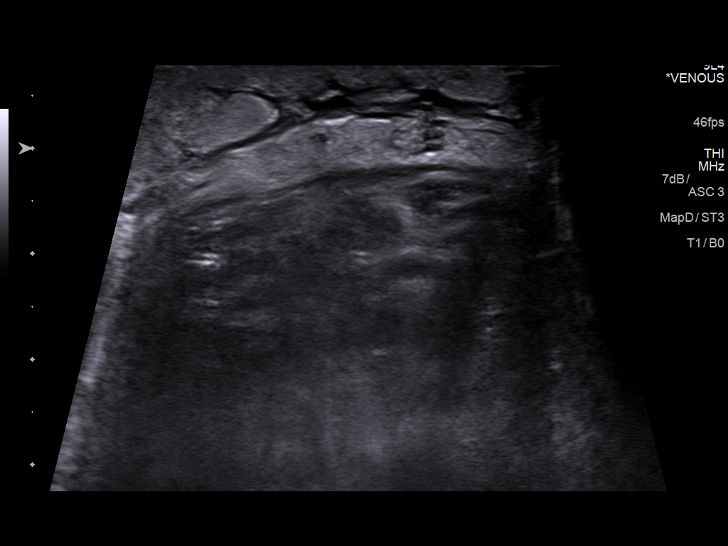

[13 of 24 positions shown; findings below may reference images not displayed]

FINDINGS: Contralateral Common Femoral Vein: Respiratory phasicity is normal
and symmetric with the symptomatic side. No evidence of thrombus.
Normal compressibility.

Common Femoral Vein: No evidence of thrombus. Normal
compressibility, respiratory phasicity and response to augmentation.

Saphenofemoral Junction: No evidence of thrombus. Normal
compressibility and flow on color Doppler imaging.

Profunda Femoral Vein: No evidence of thrombus. Normal
compressibility and flow on color Doppler imaging.

Femoral Vein: No evidence of thrombus. Normal compressibility,
respiratory phasicity and response to augmentation.

Popliteal Vein: No evidence of thrombus. Normal compressibility,
respiratory phasicity and response to augmentation.

Calf Veins: No evidence of thrombus. Normal compressibility and flow
on color Doppler imaging.

Superficial Great Saphenous Vein: No evidence of thrombus. Normal
compressibility and flow on color Doppler imaging.

Venous Reflux:  None.

Other Findings:  Subcutaneous edema within the right ankle and calf.
IMPRESSION: No evidence of deep venous thrombosis.

## 2017-09-03 NOTE — Progress Notes (Signed)
HPI:   Katie Fischer is a 62 y.o. female, who is here today for 6 months follow up.   She was last seen on 02/25/2017.  Since her last OV she has had her colonoscopy.   Currently she is on Doxepin 20 mg daily. She was on trazodone 100 mg daily at bedtime but it was not working.  She is sleeping about 6-7 hours, sometimes she still wakes up in the middle of the night.  She does not feel rested the next day, she attributes this to a stressful job  She has noted some weight gain but she thinks it is related to increased eating during the holidays. She denies dry mouth, palpitation, abdominal pain, constipation, next day drowsiness, numbness, or tingling.  In general she is glad about improvements in sleep since she has been taking Doxepin.    Review of Systems  Constitutional: Negative for activity change, appetite change, fatigue and fever.  HENT: Negative for mouth sores, nosebleeds and trouble swallowing.   Respiratory: Negative for shortness of breath and wheezing.   Cardiovascular: Negative for chest pain, palpitations and leg swelling.  Gastrointestinal: Negative for abdominal pain, nausea and vomiting.       Negative for changes in bowel habits.  Genitourinary: Negative for decreased urine volume and hematuria.  Neurological: Negative for syncope, weakness, numbness and headaches.  Psychiatric/Behavioral: Positive for sleep disturbance. Negative for confusion and hallucinations. The patient is not nervous/anxious.       Current Outpatient Medications on File Prior to Visit  Medication Sig Dispense Refill  . calcium carbonate 200 MG capsule Take 250 mg by mouth daily.      . fexofenadine (ALLEGRA) 180 MG tablet Take 180 mg by mouth daily.    . Multiple Vitamin (MULTIVITAMIN) tablet Take 1 tablet by mouth daily.    . Probiotic Product (PROBIOTIC DAILY PO) Take 1 capsule by mouth daily.     Current Facility-Administered Medications on File Prior to Visit    Medication Dose Route Frequency Provider Last Rate Last Dose  . 0.9 %  sodium chloride infusion  500 mL Intravenous Continuous Gatha Mayer, MD         Past Medical History:  Diagnosis Date  . Allergy   . Depression   . Eating disorder    as a teenager  . Headache(784.0)   . PVD (peripheral vascular disease) ([facility])    Allergies  Allergen Reactions  . Codeine     REACTION: nausea    Social History   Socioeconomic History  . Marital status: Married    Spouse name: None  . Number of children: None  . Years of education: None  . Highest education level: None  Social Needs  . Financial resource strain: None  . Food insecurity - worry: None  . Food insecurity - inability: None  . Transportation needs - medical: None  . Transportation needs - non-medical: None  Occupational History  . None  Tobacco Use  . Smoking status: Former Smoker    Last attempt to quit: 07/24/1987    Years since quitting: 30.1  . Smokeless tobacco: Never Used  Substance and Sexual Activity  . Alcohol use: Yes    Alcohol/week: 4.2 oz    Types: 7 Glasses of wine per week    Comment: 1 glass wine daily per pt  . Drug use: No  . Sexual activity: Not Currently  Other Topics Concern  . None  Social History Narrative  .  None    Vitals:   09/04/17 0741  BP: 125/84  Pulse: 73  Resp: 12  Temp: 98.3 F (36.8 C)  SpO2: 97%   Body mass index is 29.68 kg/m.    Physical Exam  Nursing note and vitals reviewed. Constitutional: She is oriented to person, place, and time. She appears well-developed. No distress.  HENT:  Head: Normocephalic and atraumatic.  Mouth/Throat: Oropharynx is clear and moist and mucous membranes are normal.  Eyes: Conjunctivae are normal. Pupils are equal, round, and reactive to light.  Cardiovascular: Normal rate and regular rhythm.  No murmur heard. Respiratory: Effort normal and breath sounds normal. No respiratory distress.  GI: Soft. She exhibits no mass. There  is no hepatomegaly. There is no tenderness.  Musculoskeletal: She exhibits no edema or tenderness.  Lymphadenopathy:    She has no cervical adenopathy.  Neurological: She is alert and oriented to person, place, and time. She has normal strength. Coordination normal.  Skin: Skin is warm. No erythema.  Psychiatric: She has a normal mood and affect.  Well groomed, good eye contact.    ASSESSMENT AND PLAN:   Katie Fischer was seen today for 6 months follow-up.  No orders of the defined types were placed in this encounter.   Insomnia Improved and tolerating Doxepin well. Because sometimes she still wakes up,Doxepin increased from 20 mg to 25 mg at night.She can skip med during weekends if she wants to. Side effects discussed. She was instructed to let me know if she has any problem with the new dose,otherwise I think we can follow annually with her physicals,before if needed. She agrees with plan.    -Ms. Nannette Zill was advised to return sooner than planned today if new concerns arise.       Florie Carico G. Martinique, MD  [facility]. [place] office.

## 2017-09-04 ENCOUNTER — Encounter: Payer: Self-pay | Admitting: Family Medicine

## 2017-09-04 ENCOUNTER — Ambulatory Visit: Payer: 59 | Admitting: Family Medicine

## 2017-09-04 VITALS — BP 125/84 | HR 73 | Temp 98.3°F | Resp 12 | Ht 62.0 in | Wt 162.2 lb

## 2017-09-04 DIAGNOSIS — G4700 Insomnia, unspecified: Secondary | ICD-10-CM | POA: Diagnosis not present

## 2017-09-04 MED ORDER — DOXEPIN HCL 25 MG PO CAPS: 25.0000 mg | ORAL_CAPSULE | Freq: Every day | ORAL | 3 refills | Status: DC

## 2017-09-04 NOTE — Patient Instructions (Signed)
A few things to remember from today's visit:   Insomnia, unspecified type - Plan: doxepin (SINEQUAN) 25 MG capsule  Since problem is stable,I can see you annually. Let me know if any problem with med.  Please be sure medication list is accurate. If a new problem present, please set up appointment sooner than planned today.

## 2017-09-04 NOTE — Assessment & Plan Note (Signed)
Improved and tolerating Doxepin well. Because sometimes she still wakes up,Doxepin increased from 20 mg to 25 mg at night.She can skip med during weekends if she wants to. Side effects discussed. She was instructed to let me know if she has any problem with the new dose,otherwise I think we can follow annually with her physicals,before if needed. She agrees with plan.

## 2017-11-14 DIAGNOSIS — D225 Melanocytic nevi of trunk: Secondary | ICD-10-CM | POA: Diagnosis not present

## 2017-11-14 DIAGNOSIS — D1801 Hemangioma of skin and subcutaneous tissue: Secondary | ICD-10-CM | POA: Diagnosis not present

## 2017-11-14 DIAGNOSIS — D2371 Other benign neoplasm of skin of right lower limb, including hip: Secondary | ICD-10-CM | POA: Diagnosis not present

## 2018-02-03 ENCOUNTER — Encounter: Payer: Self-pay | Admitting: Family Medicine

## 2018-02-03 DIAGNOSIS — H04123 Dry eye syndrome of bilateral lacrimal glands: Secondary | ICD-10-CM | POA: Diagnosis not present

## 2018-02-03 DIAGNOSIS — H2513 Age-related nuclear cataract, bilateral: Secondary | ICD-10-CM | POA: Diagnosis not present

## 2018-02-03 DIAGNOSIS — H35363 Drusen (degenerative) of macula, bilateral: Secondary | ICD-10-CM | POA: Diagnosis not present

## 2018-02-03 DIAGNOSIS — H25013 Cortical age-related cataract, bilateral: Secondary | ICD-10-CM | POA: Diagnosis not present

## 2018-05-19 DIAGNOSIS — Z1231 Encounter for screening mammogram for malignant neoplasm of breast: Secondary | ICD-10-CM | POA: Diagnosis not present

## 2018-05-19 LAB — HM MAMMOGRAPHY

## 2018-05-26 ENCOUNTER — Encounter: Payer: Self-pay | Admitting: Family Medicine

## 2018-08-07 ENCOUNTER — Other Ambulatory Visit: Payer: Self-pay | Admitting: Family Medicine

## 2018-08-07 DIAGNOSIS — G4700 Insomnia, unspecified: Secondary | ICD-10-CM

## 2018-08-13 ENCOUNTER — Encounter: Payer: Self-pay | Admitting: Family Medicine

## 2018-08-13 ENCOUNTER — Ambulatory Visit (INDEPENDENT_AMBULATORY_CARE_PROVIDER_SITE_OTHER): Payer: 59 | Admitting: Family Medicine

## 2018-08-13 VITALS — BP 120/78 | HR 62 | Temp 98.1°F | Resp 12 | Ht 63.0 in | Wt 166.0 lb

## 2018-08-13 DIAGNOSIS — Z13220 Encounter for screening for lipoid disorders: Secondary | ICD-10-CM

## 2018-08-13 DIAGNOSIS — G4700 Insomnia, unspecified: Secondary | ICD-10-CM | POA: Diagnosis not present

## 2018-08-13 DIAGNOSIS — Z1329 Encounter for screening for other suspected endocrine disorder: Secondary | ICD-10-CM | POA: Diagnosis not present

## 2018-08-13 DIAGNOSIS — Z0000 Encounter for general adult medical examination without abnormal findings: Secondary | ICD-10-CM

## 2018-08-13 DIAGNOSIS — Z130 Encounter for screening for diseases of the blood and blood-forming organs and certain disorders involving the immune mechanism: Secondary | ICD-10-CM | POA: Diagnosis not present

## 2018-08-13 DIAGNOSIS — Z13228 Encounter for screening for other metabolic disorders: Secondary | ICD-10-CM

## 2018-08-13 LAB — LIPID PANEL
CHOLESTEROL: 187 mg/dL (ref 0–200)
HDL: 72 mg/dL (ref >39.00)
LDL Cholesterol: 103 mg/dL — ABNORMAL HIGH (ref 0–99)
NONHDL: 114.87
Total CHOL/HDL Ratio: 3
Triglycerides: 60 mg/dL (ref 0.0–149.0)
VLDL: 12 mg/dL (ref 0.0–40.0)

## 2018-08-13 LAB — COMPREHENSIVE METABOLIC PANEL
ALK PHOS: 56 U/L (ref 39–117)
ALT: 16 U/L (ref 0–35)
AST: 21 U/L (ref 0–37)
Albumin: 4.6 g/dL (ref 3.5–5.2)
BILIRUBIN TOTAL: 0.6 mg/dL (ref 0.2–1.2)
BUN: 18 mg/dL (ref 6–23)
CALCIUM: 9.8 mg/dL (ref 8.4–10.5)
CO2: 32 mEq/L (ref 19–32)
Chloride: 101 mEq/L (ref 96–112)
Creatinine, Ser: 0.61 mg/dL (ref 0.40–1.20)
GFR: 99 mL/min (ref >60.00)
GLUCOSE: 94 mg/dL (ref 70–99)
Potassium: 4 mEq/L (ref 3.5–5.1)
Sodium: 139 mEq/L (ref 135–145)
Total Protein: 6.7 g/dL (ref 6.0–8.3)

## 2018-08-13 MED ORDER — DOXEPIN HCL 25 MG PO CAPS: 25.0000 mg | ORAL_CAPSULE | Freq: Every day | ORAL | 3 refills | Status: DC

## 2018-08-13 NOTE — Patient Instructions (Signed)
A few things to remember from today's visit:   Routine general medical examination at a health care facility  Insomnia, unspecified type - Plan: doxepin (SINEQUAN) 25 MG capsule, DISCONTINUED: doxepin (SINEQUAN) 25 MG capsule  Screening for lipoid disorders - Plan: Lipid panel  Screening for endocrine, metabolic and immunity disorder - Plan: Comprehensive metabolic panel  Today you have you routine preventive visit.  At least 150 minutes of moderate exercise per week, daily brisk walking for 15-30 min is a good exercise option. Healthy diet low in saturated (animal) fats and sweets and consisting of fresh fruits and vegetables, lean meats such as fish and white chicken and whole grains.  These are some of recommendations for screening depending of age and risk factors:   - Vaccines:  Tdap vaccine every 10 years.  Shingles vaccine recommended at age 53, could be given after 64 years of age but not sure about insurance coverage.   Pneumonia vaccines:  Prevnar 13 at 65 and Pneumovax at 19. Sometimes Pneumovax is giving earlier if history of smoking, lung disease,diabetes,kidney disease among some.    Screening for diabetes at age 33 and every 3 years.  Cervical cancer prevention:  Pap smear starts at 64 years of age and continues periodically until 64 years old in low risk women. Pap smear every 3 years between 43 and 73 years old. Pap smear every 3-5 years between women 94 and older if pap smear negative and HPV screening negative.   -Breast cancer: Mammogram: There is disagreement between experts about when to start screening in low risk asymptomatic female but recent recommendations are to start screening at 20 and not later than 64 years old , every 1-2 years and after 64 yo q 2 years. Screening is recommended until 64 years old but some women can continue screening depending of healthy issues.   Colon cancer screening: starts at 64 years old until 64 years  old.  Cholesterol disorder screening at age 57 and every 3 years.  Also recommended:  1. Dental visit- Brush and floss your teeth twice daily; visit your dentist twice a year. 2. Eye doctor- Get an eye exam at least every 2 years. 3. Helmet use- Always wear a helmet when riding a bicycle, motorcycle, rollerblading or skateboarding. 4. Safe sex- If you may be exposed to sexually transmitted infections, use a condom. 5. Seat belts- Seat belts can save your live; always wear one. 6. Smoke/Carbon Monoxide detectors- These detectors need to be installed on the appropriate level of your home. Replace batteries at least once a year. 7. Skin cancer- When out in the sun please cover up and use sunscreen 15 SPF or higher. 8. Violence- If anyone is threatening or hurting you, please tell your healthcare provider.  9. Drink alcohol in moderation- Limit alcohol intake to one drink or less per day. Never drink and drive.  Please be sure medication list is accurate. If a new problem present, please set up appointment sooner than planned today.

## 2018-08-13 NOTE — Assessment & Plan Note (Signed)
Problem is stable. Medication is still helping, so she will continue doxepin 25 mg daily. We discussed some side effects. Good sleep hygiene also recommended. Follow-up annually, before if needed.

## 2018-08-13 NOTE — Progress Notes (Signed)
HPI:   Katie Fischer is a 63 y.o. female, who is here today for her routine physical.  Last CPE: 11/2016  Regular exercise 3 or more time per week: Yes, she goes to the gym 2-3 times per week,wts and Zumba. Following a healthful diet: Yes. She lives with her husband.  Chronic medical problems: Insomnia, anxiety, seasonal allergies, and leg cramps and was on.   Insomnia,she is on Doxepin 25 mg daily,which helps. About 6 hours max,sometiems 3 hours. Problem is exacerbated by stress at work.   Immunization History  Administered Date(s) Administered  . Influenza,inj,Quad PF,6+ Mos 04/14/2013  . Influenza-Unspecified 06/06/2014, 03/01/2017  . Td 04/11/2009  . Tdap 01/21/2016   Reporting pap smear as up to date. Mammogram: 04/2018 Bi-Rads 1 Colonoscopy: 02/2017 DEXA: 2016,osteoporosis.Following with gyn.  Hep C screening: 11/2016 NR  She has no concerns today.    Review of Systems  Constitutional: Negative for appetite change, fatigue and fever.  HENT: Negative for dental problem, hearing loss, mouth sores, sore throat, trouble swallowing and voice change.   Eyes: Negative for redness and visual disturbance.  Respiratory: Negative for cough, shortness of breath and wheezing.   Cardiovascular: Negative for chest pain and leg swelling.  Gastrointestinal: Negative for abdominal pain, nausea and vomiting.       No changes in bowel habits.  Endocrine: Negative for cold intolerance, heat intolerance, polydipsia, polyphagia and polyuria.  Genitourinary: Negative for decreased urine volume, dysuria, hematuria, vaginal bleeding and vaginal discharge.  Musculoskeletal: Negative for arthralgias, gait problem and myalgias.  Skin: Negative for color change and rash.  Allergic/Immunologic: Negative for environmental allergies.  Neurological: Negative for syncope, weakness and headaches.  Hematological: Negative for adenopathy. Does not bruise/bleed easily.    Psychiatric/Behavioral: Negative for confusion,positive for sleep disturbance. The patient is not nervous/anxious.   All other systems reviewed and are negative.     Current Outpatient Medications on File Prior to Visit  Medication Sig Dispense Refill  . calcium carbonate 200 MG capsule Take 250 mg by mouth daily.      . fexofenadine (ALLEGRA) 180 MG tablet Take 180 mg by mouth daily.    . Multiple Vitamin (MULTIVITAMIN) tablet Take 1 tablet by mouth daily.    . Probiotic Product (PROBIOTIC DAILY PO) Take 1 capsule by mouth daily.     Current Facility-Administered Medications on File Prior to Visit  Medication Dose Route Frequency Provider Last Rate Last Dose  . 0.9 %  sodium chloride infusion  500 mL Intravenous Continuous Gatha Mayer, MD         Past Medical History:  Diagnosis Date  . Allergy   . Depression   . Eating disorder    as a teenager  . Headache(784.0)   . PVD (peripheral vascular disease) ([place])     Past Surgical History:  Procedure Laterality Date  . COLONOSCOPY  10 years ago    pt doesn't remember.  Marland Kitchen SKIN CANCER EXCISION    . TONSILLECTOMY      Allergies  Allergen Reactions  . Codeine     REACTION: nausea    Family History  Problem Relation Age of Onset  . Diabetes Mother   . Hypertension Mother   . Hyperlipidemia Mother   . Alcohol abuse Mother   . Thyroid disease Mother   . COPD Father   . Colon cancer Neg Hx   . Esophageal cancer Neg Hx   . Stomach cancer Neg Hx   .  Rectal cancer Neg Hx     Social History   Socioeconomic History  . Marital status: Married    Spouse name: Not on file  . Number of children: Not on file  . Years of education: Not on file  . Highest education level: Not on file  Occupational History  . Not on file  Social Needs  . Financial resource strain: Not on file  . Food insecurity:    Worry: Not on file    Inability: Not on file  . Transportation needs:    Medical: Not on file    Non-medical: Not on  file  Tobacco Use  . Smoking status: Former Smoker    Last attempt to quit: 07/24/1987    Years since quitting: 31.0  . Smokeless tobacco: Never Used  Substance and Sexual Activity  . Alcohol use: Yes    Alcohol/week: 7.0 standard drinks    Types: 7 Glasses of wine per week    Comment: 1 glass wine daily per pt  . Drug use: No  . Sexual activity: Not Currently  Lifestyle  . Physical activity:    Days per week: Not on file    Minutes per session: Not on file  . Stress: Not on file  Relationships  . Social connections:    Talks on phone: Not on file    Gets together: Not on file    Attends religious service: Not on file    Active member of club or organization: Not on file    Attends meetings of clubs or organizations: Not on file    Relationship status: Not on file  Other Topics Concern  . Not on file  Social History Narrative  . Not on file     Vitals:   08/13/18 0807  BP: 120/78  Pulse: 62  Resp: 12  Temp: 98.1 F (36.7 C)  SpO2: 95%   Body mass index is 29.41 kg/m.   Wt Readings from Last 3 Encounters:  08/13/18 166 lb (75.3 kg)  09/04/17 162 lb 4 oz (73.6 kg)  03/15/17 152 lb (68.9 kg)     Physical Exam  Nursing note and vitals reviewed. Constitutional: She is oriented to person, place, and time. She appears well-developed. No distress.  HENT:  Head: Normocephalic and atraumatic.  Right Ear: Hearing, tympanic membrane, external ear and ear canal normal.  Left Ear: Hearing, tympanic membrane, external ear and ear canal normal.  Mouth/Throat: Uvula is midline, oropharynx is clear and moist and mucous membranes are normal.  Eyes: Pupils are equal, round, and reactive to light. Conjunctivae and EOM are normal.  Neck: No tracheal deviation present. No thyromegaly present.  Cardiovascular: Normal rate and regular rhythm.  No murmur heard. Pulses:      Dorsalis pedis pulses are 2+ on the right side, and 2+ on the left side.  Respiratory: Effort normal and  breath sounds normal. No respiratory distress.  GI: Soft. She exhibits no mass. There is no hepatomegaly. There is no tenderness.  Genitourinary:  Genitourinary Comments: Deferred to gyn.  Musculoskeletal: She exhibits no edema.  No major deformity or signs of synovitis appreciated.  Lymphadenopathy:    She has no cervical adenopathy.       Right: No supraclavicular adenopathy present.       Left: No supraclavicular adenopathy present.  Neurological: She is alert and oriented to person, place, and time. She has normal strength. No cranial nerve deficit. Coordination and gait normal.  Reflex Scores:  Bicep reflexes are 2+ on the right side and 2+ on the left side.      Patellar reflexes are 2+ on the right side and 2+ on the left side. Skin: Skin is warm. No rash noted. No erythema.  Psychiatric: She has a normal mood and affect. Well groomed, good eye contact.     ASSESSMENT AND PLAN:  Katie Fischer was here today annual physical examination.   Orders Placed This Encounter  Procedures  . Comprehensive metabolic panel  . Lipid panel    Lab Results  Component Value Date   CHOL 187 08/13/2018   HDL 72.00 08/13/2018   LDLCALC 103 (H) 08/13/2018   TRIG 60.0 08/13/2018   CHOLHDL 3 08/13/2018   Lab Results  Component Value Date   CREATININE 0.61 08/13/2018   BUN 18 08/13/2018   NA 139 08/13/2018   K 4.0 08/13/2018   CL 101 08/13/2018   CO2 32 08/13/2018     Routine general medical examination at a health care facility We discussed the importance of regular physical activity and healthy diet for prevention of chronic illness and/or complications. Preventive guidelines reviewed. Vaccination up to date. She will continue following with gynecologist for her female preventive care.  Ca++ and vit D supplementation recommended. Next CPE in a year.  The 10-year ASCVD risk score Mikey Bussing DC Brooke Bonito., et al., 2013) is: 3.3%   Values used to calculate the score:     Age: 54  years     Sex: Female     Is Non-Hispanic African American: No     Diabetic: No     Tobacco smoker: No     Systolic Blood Pressure: 607 mmHg     Is BP treated: No     HDL Cholesterol: 72 mg/dL     Total Cholesterol: 187 mg/dL  Screening for lipoid disorders -     Lipid panel  Screening for endocrine, metabolic and immunity disorder -     Comprehensive metabolic panel   Insomnia Problem is stable. Medication is still helping, so she will continue doxepin 25 mg daily. We discussed some side effects. Good sleep hygiene also recommended. Follow-up annually, before if needed.    Return in 1 year (on 08/14/2019) for cpe.     Betty G. Martinique, MD  [facility] [place] office.

## 2018-08-17 ENCOUNTER — Encounter: Payer: Self-pay | Admitting: Family Medicine

## 2018-11-19 DIAGNOSIS — D1801 Hemangioma of skin and subcutaneous tissue: Secondary | ICD-10-CM | POA: Diagnosis not present

## 2018-11-19 DIAGNOSIS — L2082 Flexural eczema: Secondary | ICD-10-CM | POA: Diagnosis not present

## 2018-11-19 DIAGNOSIS — L249 Irritant contact dermatitis, unspecified cause: Secondary | ICD-10-CM | POA: Diagnosis not present

## 2019-05-25 ENCOUNTER — Ambulatory Visit: Payer: 59 | Admitting: Family Medicine

## 2019-05-25 ENCOUNTER — Other Ambulatory Visit: Payer: Self-pay

## 2019-05-25 ENCOUNTER — Encounter: Payer: Self-pay | Admitting: Family Medicine

## 2019-05-25 VITALS — BP 142/90 | HR 82 | Temp 97.6°F | Resp 16 | Ht 63.0 in | Wt 171.4 lb

## 2019-05-25 DIAGNOSIS — F331 Major depressive disorder, recurrent, moderate: Secondary | ICD-10-CM

## 2019-05-25 DIAGNOSIS — G4700 Insomnia, unspecified: Secondary | ICD-10-CM

## 2019-05-25 DIAGNOSIS — F419 Anxiety disorder, unspecified: Secondary | ICD-10-CM | POA: Diagnosis not present

## 2019-05-25 DIAGNOSIS — R030 Elevated blood-pressure reading, without diagnosis of hypertension: Secondary | ICD-10-CM | POA: Diagnosis not present

## 2019-05-25 DIAGNOSIS — F3341 Major depressive disorder, recurrent, in partial remission: Secondary | ICD-10-CM | POA: Insufficient documentation

## 2019-05-25 LAB — HM MAMMOGRAPHY

## 2019-05-25 MED ORDER — FLUOXETINE HCL 20 MG PO TABS: 20.0000 mg | ORAL_TABLET | Freq: Every day | ORAL | 2 refills | Status: DC

## 2019-05-25 MED ORDER — ZOLPIDEM TARTRATE 5 MG PO TABS: 2.5000 mg | ORAL_TABLET | Freq: Every evening | ORAL | 0 refills | Status: DC | PRN

## 2019-05-25 NOTE — Patient Instructions (Signed)
A few things to remember from today's visit:   Insomnia, unspecified type  Elevated blood pressure reading  Today we started Fluoxetine, this type of medications can increase suicidal risk. This is more prevalent among children,adolecents, and young adults with major depression or other psychiatric disorders. It can also make depression worse. Most common side effects are gastrointestinal, self limited after a few weeks: diarrhea, nausea, constipation  Or diarrhea among some.  In general it is well tolerated. We will follow closely.  Try melatonin 5 to 10 mg 1 to 2 hours after supper. Work on Publishing rights manager.  We need to wean off doxepin, so continue taking doxepin 25 mg every other day for 10 days then every 2 days for 7 days, then discontinue.  Other options to consider if you are still having issues with the sleep will be Ambien 2.5 to 5 mg.  Please be sure medication list is accurate. If a new problem present, please set up appointment sooner than planned today.

## 2019-05-25 NOTE — Progress Notes (Signed)
HPI:  Chief Complaint  Patient presents with  . Medication Management  . Insomnia    [name] is a 63 y.o. female, who is here today complaining of worsening insomnia and depressed mood.  She was last seen here in the office on 08/13/2018 for her CPE.  Depression for the past 2-3 months and getting worse. He has a long history of depression, in 1980s she was on Prozac, which she took for a while. She denies suicidal thoughts but states that she has had it many years ago. She lives with her husband. Problem has been exacerbated by changes related to COVID-19 pandemia.  Depression screen PHQ 2/9 05/25/2019  Decreased Interest 2  Down, Depressed, Hopeless 2  PHQ - 2 Score 4  Altered sleeping 3  Tired, decreased energy 3  Change in appetite 2  Feeling bad or failure about yourself  2  Trouble concentrating 2  Moving slowly or fidgety/restless 0  Suicidal thoughts 0  PHQ-9 Score 16  Difficult doing work/chores Somewhat difficult   GAD 7 : Generalized Anxiety Score 05/25/2019  Nervous, Anxious, on Edge 2  Control/stop worrying 2  Worry too much - different things 2  Trouble relaxing 2  Restless 2  Easily annoyed or irritable 2  Afraid - awful might happen 2  Total GAD 7 Score 14  Anxiety Difficulty Somewhat difficult     Insomnia: Currently she is on doxepin 25 mg daily. She has been on trazodone and melatonin. Sleeps about 6 hours, interrupted. She wakes up at 2 AM and 4 AM. + Fatigue. Problem seems to be exacerbated by an stressful job.  Today BP is mildly elevated. No history of hypertension. She does not check BP regularly.  Negative for unusual headache, visual changes, chest pain, dyspnea, palpitation, or edema.  Review of Systems  Constitutional: Positive for appetite change and fatigue. Negative for activity change and fever.  HENT: Negative for mouth sores, nosebleeds and sore throat.   Eyes: Negative for pain and redness.   Respiratory: Negative for cough, shortness of breath and wheezing.   Cardiovascular: Negative for chest pain, palpitations and leg swelling.  Gastrointestinal: Negative for abdominal pain, nausea and vomiting.       Negative for changes in bowel habits.  Endocrine: Negative for cold intolerance and heat intolerance.  Genitourinary: Negative for decreased urine volume and hematuria.  Musculoskeletal: Negative for gait problem and myalgias.  Neurological: Negative for syncope, facial asymmetry and speech difficulty.  Psychiatric/Behavioral: Negative for confusion and hallucinations.  Rest see pertinent positives and negatives per HPI.   Current Outpatient Medications on File Prior to Visit  Medication Sig Dispense Refill  . calcium carbonate 200 MG capsule Take 250 mg by mouth daily.      Marland Kitchen doxepin (SINEQUAN) 25 MG capsule Take 1 capsule (25 mg total) by mouth at bedtime. 90 capsule 3  . fexofenadine (ALLEGRA) 180 MG tablet Take 180 mg by mouth daily.    . Multiple Vitamin (MULTIVITAMIN) tablet Take 1 tablet by mouth daily.    . Probiotic Product (PROBIOTIC DAILY PO) Take 1 capsule by mouth daily.     Current Facility-Administered Medications on File Prior to Visit  Medication Dose Route Frequency Provider Last Rate Last Dose  . 0.9 %  sodium chloride infusion  500 mL Intravenous Continuous Gatha Mayer, MD         Past Medical History:  Diagnosis Date  . Allergy   . Depression   .  Eating disorder    as a teenager  . Headache(784.0)   . PVD (peripheral vascular disease) ([facility])    Allergies  Allergen Reactions  . Codeine     REACTION: nausea    Social History   Socioeconomic History  . Marital status: Married    Spouse name: Not on file  . Number of children: Not on file  . Years of education: Not on file  . Highest education level: Not on file  Occupational History  . Not on file  Social Needs  . Financial resource strain: Not on file  . Food insecurity     Worry: Not on file    Inability: Not on file  . Transportation needs    Medical: Not on file    Non-medical: Not on file  Tobacco Use  . Smoking status: Former Smoker    Quit date: 07/24/1987    Years since quitting: 31.8  . Smokeless tobacco: Never Used  Substance and Sexual Activity  . Alcohol use: Yes    Alcohol/week: 7.0 standard drinks    Types: 7 Glasses of wine per week    Comment: 1 glass wine daily per pt  . Drug use: No  . Sexual activity: Not Currently  Lifestyle  . Physical activity    Days per week: Not on file    Minutes per session: Not on file  . Stress: Not on file  Relationships  . Social Herbalist on phone: Not on file    Gets together: Not on file    Attends religious service: Not on file    Active member of club or organization: Not on file    Attends meetings of clubs or organizations: Not on file    Relationship status: Not on file  Other Topics Concern  . Not on file  Social History Narrative  . Not on file    Vitals:   05/25/19 0937  BP: (!) 142/90  Pulse: 82  Resp: 16  Temp: 97.6 F (36.4 C)  SpO2: 92%   Body mass index is 30.36 kg/m.      Physical Exam  Nursing note and vitals reviewed. Constitutional: She is oriented to person, place, and time. She appears well-developed. No distress.  HENT:  Head: Normocephalic and atraumatic.  Mouth/Throat: Oropharynx is clear and moist and mucous membranes are normal.  Eyes: Pupils are equal, round, and reactive to light. Conjunctivae and EOM are normal.  Cardiovascular: Normal rate and regular rhythm.  No murmur heard. Pulses:      Dorsalis pedis pulses are 2+ on the right side and 2+ on the left side.  Respiratory: Effort normal and breath sounds normal. No respiratory distress.  GI: Soft. She exhibits no mass. There is no hepatomegaly. There is no abdominal tenderness.  Musculoskeletal:        General: No edema.  Lymphadenopathy:    She has no cervical adenopathy.   Neurological: She is alert and oriented to person, place, and time. She has normal strength. No cranial nerve deficit. Gait normal.  Skin: Skin is warm. No rash noted. No erythema.  Psychiatric: Her mood appears anxious. Cognition and memory are normal. She expresses no suicidal ideation. She expresses no suicidal plans.  Well groomed, good eye contact.    ASSESSMENT AND PLAN:  Ms. Katie Fischer was seen today for medication management and insomnia.  Diagnoses and all orders for this visit:  Insomnia, unspecified type She has tried trazodone and doxepin with no benefit.  I do not recommend higher doses of doxepin due to risk of interaction with fluoxetine, so recommend starting weaning off doxepin. Recommend trying first OTC melatonin 5 to 10 mg 1 to 2 hours after supper. We discussed some side effects of Ambien, she would like a prescription just in case she needs it, recommend starting with 2.5 mg at bedtime.  -     zolpidem (AMBIEN) 5 MG tablet; Take 0.5-1 tablets (2.5-5 mg total) by mouth at bedtime as needed for sleep.  Elevated blood pressure reading Repeated BP, elevated at 145/80. Recommend monitoring BP at home. Low-salt diet and weight loss may help.  Depression, major, recurrent, moderate ([place]) Getting worse. She tried fluoxetine in the past and it did not help, so she agrees with resuming medication at 20 mg daily. Side effect discussed. Instructed about warning signs.  -     FLUoxetine (PROZAC) 20 MG tablet; Take 1 tablet (20 mg total) by mouth daily.  Anxiety disorder, unspecified type Fluoxetine 20 mg started today. Recommend considering psychotherapy, she is not interested for now.  -     FLUoxetine (PROZAC) 20 MG tablet; Take 1 tablet (20 mg total) by mouth daily.   Return in about 5 weeks (around 06/29/2019) for Can be virtual.    Josephine Wooldridge G. Martinique, MD  [facility]. [place] office.

## 2019-06-02 ENCOUNTER — Other Ambulatory Visit: Payer: Self-pay

## 2019-06-02 DIAGNOSIS — Z20822 Contact with and (suspected) exposure to covid-19: Secondary | ICD-10-CM

## 2019-06-05 LAB — NOVEL CORONAVIRUS, NAA: SARS-CoV-2, NAA: NOT DETECTED

## 2019-06-17 ENCOUNTER — Encounter: Payer: Self-pay | Admitting: Family Medicine

## 2019-06-17 NOTE — Telephone Encounter (Signed)
Please advise. Patient was last seen on 05/25/2019. Does she need a visit for refills?

## 2019-06-21 ENCOUNTER — Encounter: Payer: Self-pay | Admitting: Family Medicine

## 2019-06-22 NOTE — Telephone Encounter (Signed)
[facility] pharmacy and was sent to voicemail. Will call again.

## 2019-06-24 ENCOUNTER — Telehealth (INDEPENDENT_AMBULATORY_CARE_PROVIDER_SITE_OTHER): Payer: 59 | Admitting: Family Medicine

## 2019-06-24 ENCOUNTER — Other Ambulatory Visit: Payer: Self-pay

## 2019-06-24 ENCOUNTER — Encounter: Payer: Self-pay | Admitting: Family Medicine

## 2019-06-24 VITALS — Ht 63.0 in

## 2019-06-24 DIAGNOSIS — F419 Anxiety disorder, unspecified: Secondary | ICD-10-CM

## 2019-06-24 DIAGNOSIS — F331 Major depressive disorder, recurrent, moderate: Secondary | ICD-10-CM

## 2019-06-24 DIAGNOSIS — G4700 Insomnia, unspecified: Secondary | ICD-10-CM

## 2019-06-24 MED ORDER — DOXEPIN HCL 25 MG PO CAPS: 25.0000 mg | ORAL_CAPSULE | Freq: Every day | ORAL | 2 refills | Status: DC

## 2019-06-24 MED ORDER — FLUOXETINE HCL 20 MG PO TABS: 20.0000 mg | ORAL_TABLET | Freq: Every day | ORAL | 2 refills | Status: DC

## 2019-06-24 NOTE — Progress Notes (Signed)
Virtual Visit via Video Note   I connected with Katie Fischer on 06/24/19 by a video enabled telemedicine application and verified that I am speaking with the correct person using two identifiers.  Location patient: home Location provider:work office Persons participating in the virtual visit: patient, provider  I discussed the limitations of evaluation and management by telemedicine and the availability of in person appointments. The patient expressed understanding and agreed to proceed.   HPI: Katie Fischer is a 63 yo female who is being seen today to follow-up on anxiety,depression,and anxiety. She was last seen on 05/25/2019, when fluoxetine 20 mg and Ambien 5 mg were added. She did not try Ambien. She started taking melatonin as instructed and resumed doxepin 25 mg at bedtime, she is a sleeping much better, 7 hours.  Depression and anxiety have improved. She has tolerated fluoxetine well, denies side effects. Negative for suicidal thoughts.  ROS: See pertinent positives and negatives per HPI.  Past Medical History:  Diagnosis Date  . Allergy   . Depression   . Eating disorder    as a teenager  . Headache(784.0)   . PVD (peripheral vascular disease) ([place])     Past Surgical History:  Procedure Laterality Date  . COLONOSCOPY  10 years ago    pt doesn't remember.  Marland Kitchen SKIN CANCER EXCISION    . TONSILLECTOMY      Family History  Problem Relation Age of Onset  . Diabetes Mother   . Hypertension Mother   . Hyperlipidemia Mother   . Alcohol abuse Mother   . Thyroid disease Mother   . COPD Father   . Colon cancer Neg Hx   . Esophageal cancer Neg Hx   . Stomach cancer Neg Hx   . Rectal cancer Neg Hx     Social History   Socioeconomic History  . Marital status: Married    Spouse name: Not on file  . Number of children: Not on file  . Years of education: Not on file  . Highest education level: Not on file  Occupational History  . Not on file  Social Needs  . Financial  resource strain: Not on file  . Food insecurity    Worry: Not on file    Inability: Not on file  . Transportation needs    Medical: Not on file    Non-medical: Not on file  Tobacco Use  . Smoking status: Former Smoker    Quit date: 07/24/1987    Years since quitting: 31.9  . Smokeless tobacco: Never Used  Substance and Sexual Activity  . Alcohol use: Yes    Alcohol/week: 7.0 standard drinks    Types: 7 Glasses of wine per week    Comment: 1 glass wine daily per pt  . Drug use: No  . Sexual activity: Not Currently  Lifestyle  . Physical activity    Days per week: Not on file    Minutes per session: Not on file  . Stress: Not on file  Relationships  . Social Herbalist on phone: Not on file    Gets together: Not on file    Attends religious service: Not on file    Active member of club or organization: Not on file    Attends meetings of clubs or organizations: Not on file    Relationship status: Not on file  . Intimate partner violence    Fear of current or ex partner: Not on file    Emotionally abused:  Not on file    Physically abused: Not on file    Forced sexual activity: Not on file  Other Topics Concern  . Not on file  Social History Narrative  . Not on file    Current Outpatient Medications:  .  calcium carbonate 200 MG capsule, Take 250 mg by mouth daily.  , Disp: , Rfl:  .  doxepin (SINEQUAN) 25 MG capsule, Take 1 capsule (25 mg total) by mouth at bedtime., Disp: 90 capsule, Rfl: 2 .  fexofenadine (ALLEGRA) 180 MG tablet, Take 180 mg by mouth daily., Disp: , Rfl:  .  FLUoxetine (PROZAC) 20 MG tablet, Take 1 tablet (20 mg total) by mouth daily., Disp: 90 tablet, Rfl: 2 .  Multiple Vitamin (MULTIVITAMIN) tablet, Take 1 tablet by mouth daily., Disp: , Rfl:  .  Probiotic Product (PROBIOTIC DAILY PO), Take 1 capsule by mouth daily., Disp: , Rfl:   Current Facility-Administered Medications:  .  0.9 %  sodium chloride infusion, 500 mL, Intravenous,  Continuous, Gatha Mayer, MD  EXAM:  VITALS per patient if applicable:Ht 5\' 3"  (1.6 m)   BMI 30.36 kg/m   GENERAL: alert, oriented, appears well and in no acute distress  HEENT: atraumatic, conjunctiva clear, no obvious abnormalities on inspection.  NECK: normal movements of the head and neck  LUNGS: on inspection no signs of respiratory distress, breathing rate appears normal, no obvious gross SOB, gasping or wheezing  CV: no obvious cyanosis  PSYCH/NEURO: pleasant and cooperative, no obvious depression or anxiety, speech and thought processing grossly intact  ASSESSMENT AND PLAN:  Discussed the following assessment and plan:  Insomnia, unspecified type - Plan: doxepin (SINEQUAN) 25 MG capsule Improve. Continue doxepin 25 mg at bedtime and melatonin 10 mg 1 to 2 hours after supper. Good sleep hygiene. Follow-up in 4 to 6 months.  Depression, major, recurrent, moderate ([place]) - Plan: FLUoxetine (PROZAC) 20 MG tablet Improved. Explained that fluoxetine may take up to 6 to 8 weeks to start working at the max. Continue fluoxetine 20 mg daily. We discussed side effects as well as the risk of interaction with doxepin. We discussed signs and symptoms of serotonin syndrome.  Anxiety disorder, unspecified type - Plan: FLUoxetine (PROZAC) 20 MG tablet Improved. Continue fluoxetine 20 mg daily.   I discussed the assessment and treatment plan with the patient. She was provided an opportunity to ask questions and all were answered. She agreed with the plan and demonstrated an understanding of the instructions.   The patient was advised to call back or seek an in-person evaluation if the symptoms worsen or if the condition fails to improve as anticipated.  Return in about 4 months (around 10/23/2019) for insomnia,depression,and anxiety+ CPE.   Collyn Selk Martinique, MD

## 2019-07-03 NOTE — Progress Notes (Signed)
Patient was seen virtually on 06/24/2019.

## 2019-07-06 NOTE — Progress Notes (Signed)
Patient is scheduled for her physical on 08/17/2019 at 8:30

## 2019-07-15 ENCOUNTER — Encounter: Payer: Self-pay | Admitting: Family Medicine

## 2019-07-15 ENCOUNTER — Other Ambulatory Visit: Payer: Self-pay

## 2019-07-15 DIAGNOSIS — F419 Anxiety disorder, unspecified: Secondary | ICD-10-CM

## 2019-07-15 DIAGNOSIS — F331 Major depressive disorder, recurrent, moderate: Secondary | ICD-10-CM

## 2019-07-15 MED ORDER — FLUOXETINE HCL 20 MG PO TABS: 20.0000 mg | ORAL_TABLET | Freq: Every day | ORAL | 0 refills | Status: DC

## 2019-08-17 ENCOUNTER — Other Ambulatory Visit: Payer: Self-pay

## 2019-08-17 ENCOUNTER — Ambulatory Visit (INDEPENDENT_AMBULATORY_CARE_PROVIDER_SITE_OTHER): Payer: 59 | Admitting: Family Medicine

## 2019-08-17 ENCOUNTER — Encounter: Payer: Self-pay | Admitting: Family Medicine

## 2019-08-17 VITALS — BP 132/80 | HR 90 | Temp 96.2°F | Resp 12 | Ht 65.5 in | Wt 173.0 lb

## 2019-08-17 DIAGNOSIS — F331 Major depressive disorder, recurrent, moderate: Secondary | ICD-10-CM | POA: Diagnosis not present

## 2019-08-17 DIAGNOSIS — Z0000 Encounter for general adult medical examination without abnormal findings: Secondary | ICD-10-CM

## 2019-08-17 DIAGNOSIS — Z780 Asymptomatic menopausal state: Secondary | ICD-10-CM | POA: Diagnosis not present

## 2019-08-17 DIAGNOSIS — Z13228 Encounter for screening for other metabolic disorders: Secondary | ICD-10-CM

## 2019-08-17 DIAGNOSIS — G4700 Insomnia, unspecified: Secondary | ICD-10-CM

## 2019-08-17 DIAGNOSIS — F419 Anxiety disorder, unspecified: Secondary | ICD-10-CM

## 2019-08-17 DIAGNOSIS — Z1329 Encounter for screening for other suspected endocrine disorder: Secondary | ICD-10-CM

## 2019-08-17 DIAGNOSIS — Z13220 Encounter for screening for lipoid disorders: Secondary | ICD-10-CM | POA: Diagnosis not present

## 2019-08-17 DIAGNOSIS — Z130 Encounter for screening for diseases of the blood and blood-forming organs and certain disorders involving the immune mechanism: Secondary | ICD-10-CM

## 2019-08-17 MED ORDER — FLUOXETINE HCL 20 MG PO TABS: 20.0000 mg | ORAL_TABLET | Freq: Every day | ORAL | 2 refills | Status: DC

## 2019-08-17 NOTE — Assessment & Plan Note (Signed)
Problem is stable. Continue current management. As far as medication is still helping and continue with no side effects, I think it is appropriate to follow annually, before if needed.

## 2019-08-17 NOTE — Progress Notes (Signed)
HPI:   Katie Fischer is a 64 y.o. female, who is here today for her routine physical.  Last CPE: 08/13/18.  Regular exercise 3 or more time per week: Not consistently since COVID 19 Following a healthy diet: Plenty of fruit and vegetables. Low red meet. She lives with her husband.  Chronic medical problems: Insomnia, anxiety, depression, seasonal allergies.  Pap smear 12/10/2016, reported as negative. Hx of abnormal pap smears: Negative.  Immunization History  Administered Date(s) Administered  . Influenza,inj,Quad PF,6+ Mos 04/14/2013  . Influenza-Unspecified 06/06/2014, 03/01/2017, 05/23/2018, 05/04/2019  . Td 04/11/2009  . Tdap 01/21/2016  . Zoster Recombinat (Shingrix) 03/01/2018   Mammogram: 05/25/2019. Colonoscopy: 03/15/2017. DEXA: 03/2015, osteopenia.  Hep C screening: 11/2016 NR.  She has no concerns today.  Insomnia: Currently she is on doxepin 25 mg daily, which it is still helping with her sleep, sleeping about 5 to 6 hours. Depression and anxiety: Currently she is on fluoxetine 20 mg daily, still helping. She would like to discontinue medication once she starts exercising regularly. No side effects reported.  Review of Systems  Constitutional: Negative for appetite change, fatigue and fever.  HENT: Negative for dental problem, hearing loss, mouth sores, sore throat and trouble swallowing.   Eyes: Negative for redness and visual disturbance.  Respiratory: Negative for cough, shortness of breath and wheezing.   Cardiovascular: Negative for chest pain, palpitations and leg swelling.  Gastrointestinal: Negative for abdominal pain, nausea and vomiting.       No changes in bowel habits.  Endocrine: Negative for cold intolerance, heat intolerance, polydipsia, polyphagia and polyuria.  Genitourinary: Negative for decreased urine volume, dysuria, hematuria, vaginal bleeding and vaginal discharge.  Musculoskeletal: Negative for gait problem and myalgias.  Skin:  Negative for color change and rash.  Allergic/Immunologic: Positive for environmental allergies.  Neurological: Negative for syncope, weakness and headaches.  Hematological: Negative for adenopathy. Does not bruise/bleed easily.  Psychiatric/Behavioral: Negative for confusion and suicidal ideas.  All other systems reviewed and are negative.   Current Outpatient Medications on File Prior to Visit  Medication Sig Dispense Refill  . calcium carbonate 200 MG capsule Take 250 mg by mouth daily.      Marland Kitchen doxepin (SINEQUAN) 25 MG capsule Take 1 capsule (25 mg total) by mouth at bedtime. 90 capsule 2  . fexofenadine (ALLEGRA) 180 MG tablet Take 180 mg by mouth daily.    . Multiple Vitamin (MULTIVITAMIN) tablet Take 1 tablet by mouth daily.    . Probiotic Product (PROBIOTIC DAILY PO) Take 1 capsule by mouth daily.     Current Facility-Administered Medications on File Prior to Visit  Medication Dose Route Frequency Provider Last Rate Last Admin  . 0.9 %  sodium chloride infusion  500 mL Intravenous Continuous Gatha Mayer, MD        Past Medical History:  Diagnosis Date  . Allergy   . Depression   . Eating disorder    as a teenager  . Headache(784.0)   . PVD (peripheral vascular disease) ([place])     Past Surgical History:  Procedure Laterality Date  . COLONOSCOPY  10 years ago    pt doesn't remember.  Marland Kitchen SKIN CANCER EXCISION    . TONSILLECTOMY      Allergies  Allergen Reactions  . Codeine     REACTION: nausea    Family History  Problem Relation Age of Onset  . Diabetes Mother   . Hypertension Mother   . Hyperlipidemia Mother   .  Alcohol abuse Mother   . Thyroid disease Mother   . COPD Father   . Colon cancer Neg Hx   . Esophageal cancer Neg Hx   . Stomach cancer Neg Hx   . Rectal cancer Neg Hx     Social History   Socioeconomic History  . Marital status: Married    Spouse name: Not on file  . Number of children: Not on file  . Years of education: Not on file  .  Highest education level: Not on file  Occupational History  . Not on file  Tobacco Use  . Smoking status: Former Smoker    Quit date: 07/24/1987    Years since quitting: 32.0  . Smokeless tobacco: Never Used  Substance and Sexual Activity  . Alcohol use: Yes    Alcohol/week: 7.0 standard drinks    Types: 7 Glasses of wine per week    Comment: 1 glass wine daily per pt  . Drug use: No  . Sexual activity: Not Currently  Other Topics Concern  . Not on file  Social History Narrative  . Not on file   Social Determinants of Health   Financial Resource Strain:   . Difficulty of Paying Living Expenses: Not on file  Food Insecurity:   . Worried About Charity fundraiser in the Last Year: Not on file  . Ran Out of Food in the Last Year: Not on file  Transportation Needs:   . Lack of Transportation (Medical): Not on file  . Lack of Transportation (Non-Medical): Not on file  Physical Activity:   . Days of Exercise per Week: Not on file  . Minutes of Exercise per Session: Not on file  Stress:   . Feeling of Stress : Not on file  Social Connections:   . Frequency of Communication with Friends and Family: Not on file  . Frequency of Social Gatherings with Friends and Family: Not on file  . Attends Religious Services: Not on file  . Active Member of Clubs or Organizations: Not on file  . Attends Archivist Meetings: Not on file  . Marital Status: Not on file    Vitals:   08/17/19 0813  BP: 132/80  Pulse: 90  Resp: 12  Temp: (!) 96.2 F (35.7 C)  SpO2: 96%   Body mass index is 28.35 kg/m.  Wt Readings from Last 3 Encounters:  08/17/19 173 lb (78.5 kg)  05/25/19 171 lb 6.4 oz (77.7 kg)  08/13/18 166 lb (75.3 kg)    Physical Exam  Nursing note and vitals reviewed. Constitutional: She is oriented to person, place, and time. She appears well-developed. No distress.  HENT:  Head: Normocephalic and atraumatic.  Right Ear: Hearing, tympanic membrane, external ear  and ear canal normal.  Left Ear: Hearing, tympanic membrane, external ear and ear canal normal.  Mouth/Throat: Uvula is midline, oropharynx is clear and moist and mucous membranes are normal.  Eyes: Pupils are equal, round, and reactive to light. Conjunctivae and EOM are normal.  Neck: No tracheal deviation present. No thyromegaly present.  Cardiovascular: Normal rate and regular rhythm.  No murmur heard. Pulses:      Dorsalis pedis pulses are 2+ on the right side and 2+ on the left side.  Respiratory: Effort normal and breath sounds normal. No respiratory distress.  GI: Soft. She exhibits no mass. There is no hepatomegaly. There is no abdominal tenderness.  Genitourinary:    Genitourinary Comments: Deferred to gyn.   Musculoskeletal:  General: No edema.     Comments: No major deformity or signs of synovitis appreciated.  Lymphadenopathy:    She has no cervical adenopathy.       Right: No supraclavicular adenopathy present.       Left: No supraclavicular adenopathy present.  Neurological: She is alert and oriented to person, place, and time. She has normal strength. No cranial nerve deficit. Coordination and gait normal.  Reflex Scores:      Bicep reflexes are 2+ on the right side and 2+ on the left side.      Patellar reflexes are 2+ on the right side and 2+ on the left side. Skin: Skin is warm. No rash noted. No erythema.  Psychiatric: She has a normal mood and affect. Cognition and memory are normal.  Well groomed, good eye contact.   ASSESSMENT AND PLAN:  Ms. Kayland Yeung was here today annual physical examination.  Orders Placed This Encounter  Procedures  . DEXAScan  . Comprehensive metabolic panel  . Lipid panel  . Hemoglobin A1c    Routine general medical examination at a health care facility We discussed the importance of regular physical activity and healthy diet for prevention of chronic illness and/or complications. Preventive guidelines  reviewed. Vaccination up-to-date. Continue following with gynecologist for her female preventive care.  Ca++ and vit D supplementation to continue. Next CPE in a year.  Asymptomatic postmenopausal estrogen deficiency -     DEXAScan; Future  Screening for lipoid disorders -     Lipid panel; Future  Screening for endocrine, metabolic and immunity disorder -     Comprehensive metabolic panel; Future -     Hemoglobin A1c; Future  Insomnia Problem is stable. Continue current management. As far as medication is still helping and continue with no side effects, I think it is appropriate to follow annually, before if needed.  Depression, major, recurrent, moderate ([place]) Problem is better controlled with fluoxetine 20 mg, so no changes. Planning on engaging in regular physical activity, which has helped in the past with depression and anxiety, hoping she can stop fluoxetine once she is exercising regularly. If she decides to stop fluoxetine, medication needs to be weaned off.  We do not have labs service today, so she will be back later this week for fasting labs.  Return in 1 year (on 08/16/2020) for CPE and f/u. Lab Friday..   Ellionna Buckbee G. Martinique, MD  [facility]. [place] office.

## 2019-08-17 NOTE — Assessment & Plan Note (Signed)
Problem is better controlled with fluoxetine 20 mg, so no changes. Planning on engaging in regular physical activity, which has helped in the past with depression and anxiety, hoping she can stop fluoxetine once she is exercising regularly. If she decides to stop fluoxetine, medication needs to be weaned off.

## 2019-08-17 NOTE — Patient Instructions (Addendum)
Today you have you routine preventive visit.  Routine general medical examination at a health care facility  Depression, major, recurrent, moderate ([place]), Chronic  Asymptomatic postmenopausal estrogen deficiency - Plan: DEXAScan  Screening for lipoid disorders - Plan: Lipid panel  Screening for endocrine, metabolic and immunity disorder - Plan: Comprehensive metabolic panel, Hemoglobin A1c   Please be sure medication list is accurate. If a new problem present, please set up appointment sooner than planned today.   At least 150 minutes of moderate exercise per week, daily brisk walking for 15-30 min is a good exercise option. Healthy diet low in saturated (animal) fats and sweets and consisting of fresh fruits and vegetables, lean meats such as fish and white chicken and whole grains.  These are some of recommendations for screening depending of age and risk factors:   - Vaccines:  Tdap vaccine every 10 years.  Shingles vaccine recommended at age 46, could be given after 65 years of age but not sure about insurance coverage.   Pneumonia vaccines:  Pneumovax at 8. Sometimes Pneumovax is giving earlier if history of smoking, lung disease,diabetes,kidney disease among some.  Screening for diabetes at age 55 and every 3 years.  Cervical cancer prevention:  Pap smear starts at 65 years of age and continues periodically until 65 years old in low risk women. Pap smear every 3 years between 14 and 53 years old. Pap smear every 3-5 years between women 58 and older if pap smear negative and HPV screening negative.   -Breast cancer: Mammogram: There is disagreement between experts about when to start screening in low risk asymptomatic female but recent recommendations are to start screening at 33 and not later than 65 years old , every 1-2 years and after 65 yo q 2 years. Screening is recommended until 65 years old but some women can continue screening depending of healthy  issues.   Colon cancer screening: starts at 65 years old until 65 years old.  Cholesterol disorder screening at age 25 and every 3 years.  Also recommended:  1. Dental visit- Brush and floss your teeth twice daily; visit your dentist twice a year. 2. Eye doctor- Get an eye exam at least every 2 years. 3. Helmet use- Always wear a helmet when riding a bicycle, motorcycle, rollerblading or skateboarding. 4. Safe sex- If you may be exposed to sexually transmitted infections, use a condom. 5. Seat belts- Seat belts can save your live; always wear one. 6. Smoke/Carbon Monoxide detectors- These detectors need to be installed on the appropriate level of your home. Replace batteries at least once a year. 7. Skin cancer- When out in the sun please cover up and use sunscreen 15 SPF or higher. 8. Violence- If anyone is threatening or hurting you, please tell your healthcare provider.  9. Drink alcohol in moderation- Limit alcohol intake to one drink or less per day. Never drink and drive.

## 2019-08-21 ENCOUNTER — Other Ambulatory Visit (INDEPENDENT_AMBULATORY_CARE_PROVIDER_SITE_OTHER): Payer: 59

## 2019-08-21 ENCOUNTER — Other Ambulatory Visit: Payer: Self-pay

## 2019-08-21 DIAGNOSIS — Z13228 Encounter for screening for other metabolic disorders: Secondary | ICD-10-CM | POA: Diagnosis not present

## 2019-08-21 DIAGNOSIS — Z130 Encounter for screening for diseases of the blood and blood-forming organs and certain disorders involving the immune mechanism: Secondary | ICD-10-CM | POA: Diagnosis not present

## 2019-08-21 DIAGNOSIS — Z1329 Encounter for screening for other suspected endocrine disorder: Secondary | ICD-10-CM

## 2019-08-21 DIAGNOSIS — Z13220 Encounter for screening for lipoid disorders: Secondary | ICD-10-CM

## 2019-08-21 LAB — LIPID PANEL
Cholesterol: 175 mg/dL (ref 0–200)
HDL: 63.6 mg/dL (ref >39.00)
LDL Cholesterol: 97 mg/dL (ref 0–99)
NonHDL: 111.05
Total CHOL/HDL Ratio: 3
Triglycerides: 68 mg/dL (ref 0.0–149.0)
VLDL: 13.6 mg/dL (ref 0.0–40.0)

## 2019-08-21 LAB — COMPREHENSIVE METABOLIC PANEL
ALT: 19 U/L (ref 0–35)
AST: 22 U/L (ref 0–37)
Albumin: 4.3 g/dL (ref 3.5–5.2)
Alkaline Phosphatase: 64 U/L (ref 39–117)
BUN: 18 mg/dL (ref 6–23)
CO2: 34 mEq/L — ABNORMAL HIGH (ref 19–32)
Calcium: 9.8 mg/dL (ref 8.4–10.5)
Chloride: 98 mEq/L (ref 96–112)
Creatinine, Ser: 0.64 mg/dL (ref 0.40–1.20)
GFR: 93.36 mL/min (ref >60.00)
Glucose, Bld: 87 mg/dL (ref 70–99)
Potassium: 4.4 mEq/L (ref 3.5–5.1)
Sodium: 136 mEq/L (ref 135–145)
Total Bilirubin: 0.3 mg/dL (ref 0.2–1.2)
Total Protein: 6.5 g/dL (ref 6.0–8.3)

## 2019-08-21 LAB — HEMOGLOBIN A1C: Hgb A1c MFr Bld: 5.2 % (ref 4.6–6.5)

## 2019-08-24 ENCOUNTER — Encounter: Payer: Self-pay | Admitting: Family Medicine

## 2019-09-26 ENCOUNTER — Ambulatory Visit: Payer: 59 | Attending: Nurse Practitioner

## 2019-09-26 DIAGNOSIS — Z23 Encounter for immunization: Secondary | ICD-10-CM

## 2019-09-26 NOTE — Progress Notes (Signed)
   Covid-19 Vaccination Clinic  Name:  Rebekkah Gola    MRN: [id number] DOB: 09-16-1955  09/26/2019  Ms. Pano was observed post Covid-19 immunization for 15 minutes without incident. She was provided with Vaccine Information Sheet and instruction to access the V-Safe system.   Ms. Papworth was instructed to call 911 with any severe reactions post vaccine: Marland Kitchen Difficulty breathing  . Swelling of face and throat  . A fast heartbeat  . A bad rash all over body  . Dizziness and weakness   Immunizations Administered    Name Date Dose VIS Date Route   Pfizer COVID-19 Vaccine 09/26/2019  8:34 AM 0.3 mL 07/03/2019 Intramuscular   Manufacturer: [place]   Lot: [id number]   [place]: [id number]

## 2019-10-20 ENCOUNTER — Ambulatory Visit: Payer: 59 | Attending: Internal Medicine

## 2019-10-20 DIAGNOSIS — Z23 Encounter for immunization: Secondary | ICD-10-CM

## 2019-10-20 NOTE — Progress Notes (Signed)
   Covid-19 Vaccination Clinic  Name:  Katie Fischer    MRN: [id number] DOB: 1956-01-21  10/20/2019  [name] was observed post Covid-19 immunization for 15 minutes without incident. She was provided with Vaccine Information Sheet and instruction to access the V-Safe system.   [name] was instructed to call 911 with any severe reactions post vaccine: Marland Kitchen Difficulty breathing  . Swelling of face and throat  . A fast heartbeat  . A bad rash all over body  . Dizziness and weakness   Immunizations Administered    Name Date Dose VIS Date Route   Pfizer COVID-19 Vaccine 10/20/2019  8:06 AM 0.3 mL 07/03/2019 Intramuscular   Manufacturer: [place]   Lot: [id number]   [place]: [id number]

## 2020-01-01 ENCOUNTER — Encounter: Payer: Self-pay | Admitting: Family Medicine

## 2020-01-01 ENCOUNTER — Other Ambulatory Visit: Payer: Self-pay | Admitting: Family Medicine

## 2020-01-01 DIAGNOSIS — G4700 Insomnia, unspecified: Secondary | ICD-10-CM

## 2020-01-01 DIAGNOSIS — F331 Major depressive disorder, recurrent, moderate: Secondary | ICD-10-CM

## 2020-01-01 DIAGNOSIS — F419 Anxiety disorder, unspecified: Secondary | ICD-10-CM

## 2020-01-01 MED ORDER — FLUOXETINE HCL 20 MG PO TABS: 20.0000 mg | ORAL_TABLET | Freq: Every day | ORAL | 1 refills | Status: DC

## 2020-01-01 MED ORDER — DOXEPIN HCL 25 MG PO CAPS: 25.0000 mg | ORAL_CAPSULE | Freq: Every day | ORAL | 1 refills | Status: DC

## 2020-01-05 ENCOUNTER — Other Ambulatory Visit: Payer: Self-pay | Admitting: *Deleted

## 2020-01-05 DIAGNOSIS — F419 Anxiety disorder, unspecified: Secondary | ICD-10-CM

## 2020-01-05 DIAGNOSIS — F331 Major depressive disorder, recurrent, moderate: Secondary | ICD-10-CM

## 2020-01-05 MED ORDER — FLUOXETINE HCL 20 MG PO CAPS: 20.0000 mg | ORAL_CAPSULE | Freq: Every day | ORAL | 1 refills | Status: DC

## 2020-08-19 ENCOUNTER — Other Ambulatory Visit: Payer: Self-pay | Admitting: Family Medicine

## 2020-08-19 DIAGNOSIS — G4700 Insomnia, unspecified: Secondary | ICD-10-CM

## 2020-09-01 ENCOUNTER — Other Ambulatory Visit: Payer: Self-pay

## 2020-09-02 ENCOUNTER — Encounter: Payer: Self-pay | Admitting: Family Medicine

## 2020-09-02 ENCOUNTER — Ambulatory Visit: Payer: 59 | Admitting: Family Medicine

## 2020-09-02 VITALS — BP 120/74 | HR 85 | Temp 98.0°F | Resp 16 | Ht 65.5 in | Wt 166.0 lb

## 2020-09-02 DIAGNOSIS — F331 Major depressive disorder, recurrent, moderate: Secondary | ICD-10-CM | POA: Diagnosis not present

## 2020-09-02 DIAGNOSIS — G4700 Insomnia, unspecified: Secondary | ICD-10-CM

## 2020-09-02 DIAGNOSIS — F419 Anxiety disorder, unspecified: Secondary | ICD-10-CM

## 2020-09-02 MED ORDER — FLUOXETINE HCL 20 MG PO CAPS: 20.0000 mg | ORAL_CAPSULE | Freq: Every day | ORAL | 2 refills | Status: DC

## 2020-09-02 NOTE — Patient Instructions (Addendum)
A few things to remember from today's visit:   Depression, major, recurrent, moderate (HCC) - Plan: FLUoxetine (PROZAC) 20 MG capsule  Anxiety disorder, unspecified type - Plan: FLUoxetine (PROZAC) 20 MG capsule  If you need refills please call your pharmacy. Do not use My Chart to request refills or for acute issues that need immediate attention.    Today we started Fluoxetine, this type of medications can increase suicidal risk. This is more prevalent among children,adolecents, and young adults with major depression or other psychiatric disorders. It can also make depression worse. Most common side effects are gastrointestinal, self limited after a few weeks: diarrhea, nausea, constipation  Or diarrhea among some.  In general it is well tolerated. We will follow closely.  Please let me know in 6-8 weeks if medication is helping.  Please be sure medication list is accurate. If a new problem present, please set up appointment sooner than planned today.

## 2020-09-02 NOTE — Progress Notes (Signed)
Chief Complaint  Patient presents with  . Follow-up    HPI: [name] is a 64 y.o. female, who is here today for follow up.   She was last seen on 08/17/19. Anxiety and depression: She was on Fluoxetine.  She wean off medications, she thought she was doing better. Started with symptoms 3-4 months ago, gradual and exacerbated by the death of her husband 2 weeks ago, this was unexpected. She is staying at home, alone, but has family,neighbors, and friends from church checking on her.  Depression screen [facility] 2/9 09/02/2020 05/25/2019  Decreased Interest 2 2  Down, Depressed, Hopeless 2 2  PHQ - 2 Score 4 4  Altered sleeping 3 3  Tired, decreased energy 3 3  Change in appetite 3 2  Feeling bad or failure about yourself  2 2  Trouble concentrating 2 2  Moving slowly or fidgety/restless 0 0  Suicidal thoughts 0 0  PHQ-9 Score 17 16  Difficult doing work/chores Somewhat difficult Somewhat difficult   GAD 7 : Generalized Anxiety Score 09/02/2020 05/25/2019  Nervous, Anxious, on Edge 1 2  Control/stop worrying 1 2  Worry too much - different things 1 2  Trouble relaxing 2 2  Restless 1 2  Easily annoyed or irritable 1 2  Afraid - awful might happen 2 2  Total GAD 7 Score 9 14  Anxiety Difficulty Somewhat difficult Somewhat difficult   Insomnia: She is Doxepin 25 mg at bedtime, which helps but she wakes up a few times through the night to let her dog out to urinate.One of her dogs has CH, on diuretic, and going out q 2 hours.  Review of Systems  Constitutional: Positive for fatigue. Negative for activity change, appetite change and unexpected weight change.  Respiratory: Negative for cough, shortness of breath and wheezing.   Cardiovascular: Negative for chest pain, palpitations and leg swelling.  Gastrointestinal: Negative for abdominal pain, nausea and vomiting.       No changes in bowel habits.  Skin: Negative for rash.  Neurological: Negative for syncope, weakness and  headaches.  Psychiatric/Behavioral: Positive for sleep disturbance. Negative for confusion, hallucinations and suicidal ideas.  Rest of ROS, see pertinent positives sand negatives in HPI  Current Outpatient Medications on File Prior to Visit  Medication Sig Dispense Refill  . calcium carbonate 200 MG capsule Take 250 mg by mouth daily.    Marland Kitchen doxepin (SINEQUAN) 25 MG capsule TAKE 1 CAPSULE (25 MG TOTAL) BY MOUTH AT BEDTIME. 90 capsule 1  . fexofenadine (ALLEGRA) 180 MG tablet Take 180 mg by mouth daily.    . Multiple Vitamin (MULTIVITAMIN) tablet Take 1 tablet by mouth daily.    . Probiotic Product (PROBIOTIC DAILY PO) Take 1 capsule by mouth daily.     Current Facility-Administered Medications on File Prior to Visit  Medication Dose Route Frequency Provider Last Rate Last Admin  . 0.9 %  sodium chloride infusion  500 mL Intravenous Continuous Gatha Mayer, MD       Past Medical History:  Diagnosis Date  . Allergy   . Depression   . Eating disorder    as a teenager  . Headache(784.0)   . PVD (peripheral vascular disease) (HCC)    Allergies  Allergen Reactions  . Codeine     REACTION: nausea    Social History   Socioeconomic History  . Marital status: Widowed    Spouse name: Not on file  . Number of children: Not on file  .  Years of education: Not on file  . Highest education level: Not on file  Occupational History  . Not on file  Tobacco Use  . Smoking status: Former Smoker    Quit date: 07/24/1987    Years since quitting: 33.1  . Smokeless tobacco: Never Used  Vaping Use  . Vaping Use: Never used  Substance and Sexual Activity  . Alcohol use: Yes    Alcohol/week: 7.0 standard drinks    Types: 7 Glasses of wine per week    Comment: 1 glass wine daily per pt  . Drug use: No  . Sexual activity: Not Currently  Other Topics Concern  . Not on file  Social History Narrative  . Not on file   Social Determinants of Health   Financial Resource Strain: Not on file   Food Insecurity: Not on file  Transportation Needs: Not on file  Physical Activity: Not on file  Stress: Not on file  Social Connections: Not on file   Vitals:   09/02/20 0715  BP: 120/74  Pulse: 85  Resp: 16  Temp: 98 F (36.7 C)  SpO2: 95%   Body mass index is 27.2 kg/m.  Physical Exam Vitals and nursing note reviewed.  Constitutional:      General: She is not in acute distress.    Appearance: She is well-groomed.  HENT:     Head: Normocephalic and atraumatic.  Eyes:     Conjunctiva/sclera: Conjunctivae normal.  Cardiovascular:     Rate and Rhythm: Normal rate and regular rhythm.     Heart sounds: No murmur heard.   Pulmonary:     Effort: Pulmonary effort is normal. No respiratory distress.     Breath sounds: Normal breath sounds.  Abdominal:     Palpations: Abdomen is soft. There is no mass.     Tenderness: There is no abdominal tenderness.  Neurological:     General: No focal deficit present.     Mental Status: She is alert and oriented to person, place, and time.     Gait: Gait normal.  Psychiatric:        Mood and Affect: Mood is not anxious or depressed.   ASSESSMENT AND PLAN:   Ms. Katie Fischer was seen today for follow-up.  Diagnoses and all orders for this visit:  Insomnia, unspecified type Continue Doxepin 25 mg daily. Good sleep hygiene is important.  Depression, major, recurrent, moderate (HCC) Fluoxetine helped , so she will resume. Fluoxetine 20 mg daily. CBT will also help.  -     FLUoxetine (PROZAC) 20 MG capsule; Take 1 capsule (20 mg total) by mouth daily.  Anxiety disorder, unspecified type Fluoxetine 20 mg started today. She has tolerated medication well in the past. Instructed to let me know in 6-8 week if medication is helping, before if needed.  -     FLUoxetine (PROZAC) 20 MG capsule; Take 1 capsule (20 mg total) by mouth daily.   Return in about 4 months (around 12/31/2020) for CPE.   Milford Cilento G. Martinique, MD  [facility]. [place] office.   A few things to remember from today's visit:   Depression, major, recurrent, moderate (HCC) - Plan: FLUoxetine (PROZAC) 20 MG capsule  Anxiety disorder, unspecified type - Plan: FLUoxetine (PROZAC) 20 MG capsule  If you need refills please call your pharmacy. Do not use My Chart to request refills or for acute issues that need immediate attention.    Today we started Fluoxetine, this type of medications  can increase suicidal risk. This is more prevalent among children,adolecents, and young adults with major depression or other psychiatric disorders. It can also make depression worse. Most common side effects are gastrointestinal, self limited after a few weeks: diarrhea, nausea, constipation  Or diarrhea among some.  In general it is well tolerated. We will follow closely.  Please let me know in 6-8 weeks if medication is helping.  Please be sure medication list is accurate. If a new problem present, please set up appointment sooner than planned today.

## 2020-09-04 ENCOUNTER — Encounter: Payer: Self-pay | Admitting: Family Medicine

## 2021-01-11 ENCOUNTER — Encounter: Payer: Self-pay | Admitting: Family Medicine

## 2021-01-31 ENCOUNTER — Encounter: Payer: Self-pay | Admitting: Family Medicine

## 2021-02-14 ENCOUNTER — Other Ambulatory Visit: Payer: Self-pay | Admitting: Family Medicine

## 2021-02-14 DIAGNOSIS — G4700 Insomnia, unspecified: Secondary | ICD-10-CM

## 2021-02-16 ENCOUNTER — Other Ambulatory Visit: Payer: Self-pay

## 2021-02-16 ENCOUNTER — Ambulatory Visit: Payer: 59 | Admitting: Internal Medicine

## 2021-02-17 ENCOUNTER — Ambulatory Visit (INDEPENDENT_AMBULATORY_CARE_PROVIDER_SITE_OTHER): Payer: 59 | Admitting: Internal Medicine

## 2021-02-17 ENCOUNTER — Encounter: Payer: Self-pay | Admitting: Internal Medicine

## 2021-02-17 VITALS — BP 110/80 | HR 79 | Temp 98.2°F | Wt 170.1 lb

## 2021-02-17 DIAGNOSIS — L237 Allergic contact dermatitis due to plants, except food: Secondary | ICD-10-CM

## 2021-02-17 MED ORDER — TRIAMCINOLONE ACETONIDE 0.1 % EX CREA
1.0000 | TOPICAL_CREAM | Freq: Two times a day (BID) | CUTANEOUS | 0 refills | Status: DC
Start: 2021-02-17 — End: 2021-05-22

## 2021-02-17 NOTE — Progress Notes (Signed)
Acute office Visit     This visit occurred during the SARS-CoV-2 public health emergency.  Safety protocols were in place, including screening questions prior to the visit, additional usage of staff PPE, and extensive cleaning of exam room while observing appropriate contact time as indicated for disinfecting solutions.    CC/Reason for Visit: Rash  HPI: Genese Hibbett is a 65 y.o. female who is coming in today for the above mentioned reasons.  She has 3 distinct areas over her right forearm and upper arm of a rash that is splotchy and linear.  Has been present for about a week.  She is not aware of any poison ivy/oak contact.  It is pruritic.  Past Medical/Surgical History: Past Medical History:  Diagnosis Date   Allergy    Depression    Eating disorder    as a teenager   Headache(784.0)    PVD (peripheral vascular disease) ([place])     Past Surgical History:  Procedure Laterality Date   COLONOSCOPY  10 years ago    pt doesn't remember.   SKIN CANCER EXCISION     TONSILLECTOMY      Social History:  reports that she quit smoking about 33 years ago. Her smoking use included cigarettes. She has never used smokeless tobacco. She reports current alcohol use of about 7.0 standard drinks of alcohol per week. She reports that she does not use drugs.  Allergies: Allergies  Allergen Reactions   Codeine     REACTION: nausea    Family History:  Family History  Problem Relation Age of Onset   Diabetes Mother    Hypertension Mother    Hyperlipidemia Mother    Alcohol abuse Mother    Thyroid disease Mother    COPD Father    Colon cancer Neg Hx    Esophageal cancer Neg Hx    Stomach cancer Neg Hx    Rectal cancer Neg Hx      Current Outpatient Medications:    calcium carbonate 200 MG capsule, Take 250 mg by mouth daily., Disp: , Rfl:    doxepin (SINEQUAN) 25 MG capsule, TAKE 1 CAPSULE BY MOUTH AT BEDTIME., Disp: 90 capsule, Rfl: 0   fexofenadine (ALLEGRA) 180 MG tablet,  Take 180 mg by mouth daily., Disp: , Rfl:    FLUoxetine (PROZAC) 20 MG capsule, Take 1 capsule (20 mg total) by mouth daily., Disp: 90 capsule, Rfl: 2   Multiple Vitamin (MULTIVITAMIN) tablet, Take 1 tablet by mouth daily., Disp: , Rfl:    Probiotic Product (PROBIOTIC DAILY PO), Take 1 capsule by mouth daily., Disp: , Rfl:    triamcinolone cream (KENALOG) 0.1 %, Apply 1 application topically 2 (two) times daily., Disp: 30 g, Rfl: 0  Current Facility-Administered Medications:    0.9 %  sodium chloride infusion, 500 mL, Intravenous, Continuous, Gatha Mayer, MD  Review of Systems:  Constitutional: Denies fever, chills, diaphoresis, appetite change and fatigue.  HEENT: Denies photophobia, eye pain, redness, hearing loss, ear pain, congestion, sore throat, rhinorrhea, sneezing, mouth sores, trouble swallowing, neck pain, neck stiffness and tinnitus.   Respiratory: Denies SOB, DOE, cough, chest tightness,  and wheezing.   Cardiovascular: Denies chest pain, palpitations and leg swelling.  Gastrointestinal: Denies nausea, vomiting, abdominal pain, diarrhea, constipation, blood in stool and abdominal distention.  Genitourinary: Denies dysuria, urgency, frequency, hematuria, flank pain and difficulty urinating.  Endocrine: Denies: hot or cold intolerance, sweats, changes in hair or nails, polyuria, polydipsia. Musculoskeletal: Denies myalgias, back pain,  joint swelling, arthralgias and gait problem.  Skin: Denies pallor and wound.  Neurological: Denies dizziness, seizures, syncope, weakness, light-headedness, numbness and headaches.  Hematological: Denies adenopathy. Easy bruising, personal or family bleeding history  Psychiatric/Behavioral: Denies suicidal ideation, mood changes, confusion, nervousness, sleep disturbance and agitation    Physical Exam: Vitals:   02/17/21 0703  BP: 110/80  Pulse: 79  Temp: 98.2 F (36.8 C)  TempSrc: Oral  SpO2: 96%  Weight: 170 lb 1.6 oz (77.2 kg)     Body mass index is 27.88 kg/m.   Constitutional: NAD, calm, comfortable Eyes: PERRL, lids and conjunctivae normal, wears corrective lenses ENMT: Mucous membranes are moist.  Skin: 3 distinct areas of papular lesions extending into a linear fashion over her right forearm and 2 on her upper arm on the right. Neurologic: Grossly intact and nonfocal Psychiatric: Normal judgment and insight. Alert and oriented x 3. Normal mood.    Impression and Plan:  Poison ivy dermatitis  -Classic poison ivy/oak dermatitis. - Plan: triamcinolone cream (KENALOG) 0.1 %       Graison Leinberger Isaac Bliss, MD [facility]

## 2021-05-13 ENCOUNTER — Other Ambulatory Visit: Payer: Self-pay | Admitting: Family Medicine

## 2021-05-13 DIAGNOSIS — G4700 Insomnia, unspecified: Secondary | ICD-10-CM

## 2021-05-19 NOTE — Progress Notes (Signed)
HPI: [name] is a 65 y.o. female, who is here today to follow on medications.  She was last seen on 09/02/2020.   Anxiety and depression: In 05/2019 Fluoxetine 20 mg daily was added. Patient is tolerating medication well. She has not had any side effects.  Tried to wean off Fluoxetine and did not take it for weeks, resumed 4-5 weeks ago because worsening symptoms.  Since her last visit she has been eating healthier. Her husband snacked a lot, since his death she does not keep snacks in the house.  GAD 7 : Generalized Anxiety Score 05/22/2021 09/02/2020 05/25/2019  Nervous, Anxious, on Edge 0 1 2  Control/stop worrying 0 1 2  Worry too much - different things 0 1 2  Trouble relaxing 1 2 2   Restless 1 1 2   Easily annoyed or irritable 1 1 2   Afraid - awful might happen 0 2 2  Total GAD 7 Score 3 9 14   Anxiety Difficulty Not difficult at all Somewhat difficult Somewhat difficult    Depression screen [facility] 2/9 05/22/2021 09/02/2020 05/25/2019  Decreased Interest 0 2 2  Down, Depressed, Hopeless 0 2 2  PHQ - 2 Score 0 4 4  Altered sleeping 3 3 3   Tired, decreased energy 1 3 3   Change in appetite 1 3 2   Feeling bad or failure about yourself  0 2 2  Trouble concentrating 0 2 2  Moving slowly or fidgety/restless 0 0 0  Suicidal thoughts 0 0 0  PHQ-9 Score 5 17 16   Difficult doing work/chores Not difficult at all Somewhat difficult Somewhat difficult   Insomnia: She is on Doxepin 25 mg at bedtime. She tried Trazodone and Melatonin in the past and did not help. Sleeping better but waking up a few times per night. Her dog has hx of CHF, through the night she has to take her dog out to void.  Lips burned and sometimes itches. OTC Aquaphor and lip balm have helped some.  Problem has been going on since 01/2021. Resolved while [place]. She is not sure about exacerbating or alleviating factors. Negative for edema,sore throat,stridor,cough,wheezing,SOB,N/V,changes in  bowel habits,or abdominal pain.  No new meds or dietary changes.  Review of Systems  Constitutional:  Positive for fatigue. Negative for activity change.  Eyes:  Negative for redness and itching.  Cardiovascular:  Negative for chest pain, palpitations and leg swelling.  Genitourinary:  Negative for decreased urine volume and hematuria.  Musculoskeletal:  Negative for gait problem and myalgias.  Skin:  Negative for pallor and rash.  Neurological:  Negative for syncope, weakness and headaches.  Rest see pertinent positives and negatives per HPI.  Current Outpatient Medications on File Prior to Visit  Medication Sig Dispense Refill   calcium carbonate 200 MG capsule Take 250 mg by mouth daily.     fexofenadine (ALLEGRA) 180 MG tablet Take 180 mg by mouth daily.     Multiple Vitamin (MULTIVITAMIN) tablet Take 1 tablet by mouth daily.     Probiotic Product (PROBIOTIC DAILY PO) Take 1 capsule by mouth daily.     Current Facility-Administered Medications on File Prior to Visit  Medication Dose Route Frequency Provider Last Rate Last Admin   0.9 %  sodium chloride infusion  500 mL Intravenous Continuous Gatha Mayer, MD       Past Medical History:  Diagnosis Date   Allergy    Depression    Eating disorder    as a teenager  Headache(784.0)    PVD (peripheral vascular disease) (HCC)    Allergies  Allergen Reactions   Codeine     REACTION: nausea   Social History   Socioeconomic History   Marital status: Widowed    Spouse name: Not on file   Number of children: Not on file   Years of education: Not on file   Highest education level: Not on file  Occupational History   Not on file  Tobacco Use   Smoking status: Former    Types: Cigarettes    Quit date: 07/24/1987    Years since quitting: 33.8   Smokeless tobacco: Never  Vaping Use   Vaping Use: Never used  Substance and Sexual Activity   Alcohol use: Yes    Alcohol/week: 7.0 standard drinks    Types: 7 Glasses of  wine per week    Comment: 1 glass wine daily per pt   Drug use: No   Sexual activity: Not Currently  Other Topics Concern   Not on file  Social History Narrative   Not on file   Social Determinants of Health   Financial Resource Strain: Not on file  Food Insecurity: Not on file  Transportation Needs: Not on file  Physical Activity: Not on file  Stress: Not on file  Social Connections: Not on file   Vitals:   05/22/21 0728  BP: 126/80  Pulse: 90  Resp: 16  Temp: (!) 97 F (36.1 C)  SpO2: 97%   Wt Readings from Last 3 Encounters:  05/22/21 161 lb (73 kg)  02/17/21 170 lb 1.6 oz (77.2 kg)  09/02/20 166 lb (75.3 kg)   Body mass index is 26.38 kg/m.  Physical Exam Vitals and nursing note reviewed.  Constitutional:      General: She is not in acute distress.    Appearance: She is well-developed.  HENT:     Head: Normocephalic and atraumatic.     Mouth/Throat:     Mouth: Mucous membranes are moist.     Pharynx: Oropharynx is clear.     Comments: No erythema or edema. Lower lip dry +finely scaly. Angular areas with dryness. Eyes:     Conjunctiva/sclera: Conjunctivae normal.  Cardiovascular:     Rate and Rhythm: Normal rate and regular rhythm.     Heart sounds: No murmur heard. Pulmonary:     Effort: Pulmonary effort is normal. No respiratory distress.     Breath sounds: Normal breath sounds.  Abdominal:     Palpations: Abdomen is soft. There is no hepatomegaly or mass.     Tenderness: There is no abdominal tenderness.  Lymphadenopathy:     Cervical: No cervical adenopathy.  Skin:    General: Skin is warm.     Findings: No erythema or rash.  Neurological:     General: No focal deficit present.     Mental Status: She is alert and oriented to person, place, and time.     Cranial Nerves: No cranial nerve deficit.     Gait: Gait normal.  Psychiatric:     Comments: Well groomed, good eye contact.   ASSESSMENT AND PLAN:  [name] was seen today for  follow-up.  Diagnoses and all orders for this visit: Orders Placed This Encounter  Procedures   TSH   Vitamin B12   Lab Results  Component Value Date   TSH 1.87 05/22/2021   Lab Results  Component Value Date   VITAMINB12 259 05/22/2021   Cheilitis We discussed possible etiologies. Avoid  licking lips. Small amount of Desonide oint daily prn. Some side effects of chronic topical steroid use discussed. Derma referral will be considered of problem doe snot resolve or gets worse.  -     desonide (DESOWEN) 0.05 % ointment; Apply 1 application topically daily as needed. For up to 2 weeks.  Anxiety disorder Improved after resuming medication. Continue Fluoxetine 20 mg daily. Some side effects discussed.  Depression, major, recurrent, in partial remission ([place]) Problem is better controlled while taking Fluoxetine, symptoms worsened when she weaned off medication. Continue Fluoxetine same dose. In about a year,if she is interested in trying again to wean medication we can do it but slower. Some side effects discussed as well as risk on med interaction with Doxepin. As far as symptoms are stable ,annual follow up is appropriate.  Insomnia Doxepin helps but because her dog health issues she still has to get up in the meddle of the night. Good sleep hygiene as possible. Continue Doxepin 25 mg at bedtime. Fall precautions discussed.   Return in about 1 year (around 05/22/2022).   Dalyn Kjos G. Martin[place], MD  [facility]. [place] office.

## 2021-05-22 ENCOUNTER — Ambulatory Visit: Payer: 59 | Admitting: Family Medicine

## 2021-05-22 ENCOUNTER — Other Ambulatory Visit: Payer: Self-pay

## 2021-05-22 ENCOUNTER — Encounter: Payer: Self-pay | Admitting: Family Medicine

## 2021-05-22 VITALS — BP 126/80 | HR 90 | Temp 97.0°F | Resp 16 | Ht 65.5 in | Wt 161.0 lb

## 2021-05-22 DIAGNOSIS — K130 Diseases of lips: Secondary | ICD-10-CM

## 2021-05-22 DIAGNOSIS — F331 Major depressive disorder, recurrent, moderate: Secondary | ICD-10-CM

## 2021-05-22 DIAGNOSIS — F3341 Major depressive disorder, recurrent, in partial remission: Secondary | ICD-10-CM

## 2021-05-22 DIAGNOSIS — F419 Anxiety disorder, unspecified: Secondary | ICD-10-CM | POA: Diagnosis not present

## 2021-05-22 DIAGNOSIS — G4700 Insomnia, unspecified: Secondary | ICD-10-CM

## 2021-05-22 LAB — TSH: TSH: 1.87 u[IU]/mL (ref 0.35–5.50)

## 2021-05-22 LAB — VITAMIN B12: Vitamin B-12: 259 pg/mL (ref 211–911)

## 2021-05-22 MED ORDER — FLUOXETINE HCL 20 MG PO CAPS: 20.0000 mg | ORAL_CAPSULE | Freq: Every day | ORAL | 3 refills | Status: DC

## 2021-05-22 MED ORDER — DOXEPIN HCL 25 MG PO CAPS: ORAL_CAPSULE | ORAL | 1 refills | Status: DC

## 2021-05-22 MED ORDER — DESONIDE 0.05 % EX OINT: 1.0000 "application " | TOPICAL_OINTMENT | Freq: Every day | CUTANEOUS | 1 refills | Status: DC | PRN

## 2021-05-22 NOTE — Assessment & Plan Note (Signed)
Problem is better controlled while taking Fluoxetine, symptoms worsened when she weaned off medication. Continue Fluoxetine same dose. In about a year,if she is interested in trying again to wean medication we can do it but slower. Some side effects discussed as well as risk on med interaction with Doxepin. As far as symptoms are stable ,annual follow up is appropriate.

## 2021-05-22 NOTE — Assessment & Plan Note (Signed)
Doxepin helps but because her dog health issues she still has to get up in the meddle of the night. Good sleep hygiene as possible. Continue Doxepin 25 mg at bedtime. Fall precautions discussed.

## 2021-05-22 NOTE — Assessment & Plan Note (Signed)
Improved after resuming medication. Continue Fluoxetine 20 mg daily. Some side effects discussed.

## 2021-05-22 NOTE — Patient Instructions (Signed)
A few things to remember from today's visit:   Insomnia, unspecified type  Depression, major, recurrent, moderate (HCC) - Plan: FLUoxetine (PROZAC) 20 MG capsule  Anxiety disorder, unspecified type - Plan: FLUoxetine (PROZAC) 20 MG capsule  Disorder of lip - Plan: TSH, Vitamin B12  If you need refills please call your pharmacy. Do not use My Chart to request refills or for acute issues that need immediate attention.   Doxepin small amount on lower lip and corners daily as neded.  If not better or if problem gets worse we may need derma consultation. Avoid licking lips.  No changes in rest of meds.  Please be sure medication list is accurate. If a new problem present, please set up appointment sooner than planned today.

## 2021-06-22 LAB — HM MAMMOGRAPHY

## 2021-06-26 ENCOUNTER — Encounter: Payer: Self-pay | Admitting: Family Medicine

## 2022-01-18 ENCOUNTER — Other Ambulatory Visit: Payer: Self-pay | Admitting: Family Medicine

## 2022-01-18 DIAGNOSIS — G4700 Insomnia, unspecified: Secondary | ICD-10-CM

## 2022-06-11 LAB — HM MAMMOGRAPHY

## 2022-06-13 ENCOUNTER — Encounter: Payer: Self-pay | Admitting: Family Medicine

## 2022-07-18 ENCOUNTER — Other Ambulatory Visit: Payer: Self-pay | Admitting: Family Medicine

## 2022-07-18 DIAGNOSIS — F3341 Major depressive disorder, recurrent, in partial remission: Secondary | ICD-10-CM

## 2022-07-18 DIAGNOSIS — G4700 Insomnia, unspecified: Secondary | ICD-10-CM

## 2022-07-18 DIAGNOSIS — F419 Anxiety disorder, unspecified: Secondary | ICD-10-CM

## 2022-08-13 NOTE — Progress Notes (Signed)
HPI: [name] is a 67 y.o. female with PMHx significant for insomnia, anxiety, depression, and seasonal allergies here today for her routine physical and follow up.  Last CPE: 08/17/19  She reports not exercising regularly but maintains a healthy diet, cooking at home and consuming vegetables daily. She prefers fish and chicken over red meat.  She sleeps an average of six to seven hours per night, does not smoke, and consumes one glass of wine with dinner.  Immunization History  Administered Date(s) Administered   Influenza,inj,Quad PF,6+ Mos 04/14/2013, 05/20/2020   Influenza-Unspecified 06/06/2014, 03/01/2017, 05/23/2018, 05/04/2019, 02/20/2021, 05/29/2022   PFIZER Comirnaty(Gray Top)Covid-19 Tri-Sucrose Vaccine 02/05/2021   PFIZER(Purple Top)SARS-COV-2 Vaccination 09/26/2019, 10/20/2019, 05/20/2020   PNEUMOCOCCAL CONJUGATE-20 11/20/2020   Pfizer Covid-19 Vaccine Bivalent Booster 7yr & up 05/22/2021   Td 04/11/2009   Tdap 01/21/2016   Zoster Recombinat (Shingrix) 12/13/2016, 03/01/2018, 05/22/2021   Health Maintenance  Topic Date Due   COVID-19 Vaccine (6 - 2023-24 season) 08/29/2022 (Originally 03/23/2022)   MAMMOGRAM  06/11/2024   DTaP/Tdap/Td (3 - Td or Tdap) 01/20/2026   COLONOSCOPY (Pts 45-428yrInsurance coverage will need to be confirmed)  03/16/2027   Pneumonia Vaccine 6574Years old  Completed   INFLUENZA VACCINE  Completed   DEXA SCAN  Completed   Hepatitis C Screening  Completed   Zoster Vaccines- Shingrix  Completed   HPV VACCINES  Aged Out   DEXA in 03/2015 :Osteopenia. She is on calcium and vitamin D supplementation.  Right hip pain for about 6-7 months, intermittently. Pain worsens when lying on her side and during certain physical activities.  She takes Motrin for pain relief.  She also reports feeling fatigued for the past year and has noticed hair loss. She has a family history of thyroid issues, with both her sister and mother having been on  Synthroid.  She does not feel rested when she first wakes up in the morning. She has not noted sleep apnea.  Depression and anxiety on fluoxetine 20 mg daily. She also takes doxepin 25 mg at bedtime to help with insomnia.  States that she has been feeling overwhelmed and is struggling with the decision to retire or continue working.  She is also concerned about B12 deficiency, currently she is on daily supplementation. Lab Results  Component Value Date   VITAMINB12 259 05/22/2021   Review of Systems  Constitutional:  Positive for fatigue. Negative for activity change, appetite change and fever.  HENT:  Negative for hearing loss, mouth sores, sore throat and trouble swallowing.   Eyes:  Negative for redness and visual disturbance.  Respiratory:  Negative for cough, shortness of breath and wheezing.   Cardiovascular:  Negative for chest pain and leg swelling.  Gastrointestinal:  Negative for abdominal pain, nausea and vomiting.       No changes in bowel habits.  Endocrine: Negative for cold intolerance, heat intolerance, polydipsia, polyphagia and polyuria.  Genitourinary:  Negative for decreased urine volume, dysuria, hematuria, vaginal bleeding and vaginal discharge.  Musculoskeletal:  Positive for arthralgias. Negative for gait problem and myalgias.  Skin:  Negative for color change and rash.  Allergic/Immunologic: Positive for environmental allergies.  Neurological:  Negative for syncope, weakness and headaches.  Hematological:  Negative for adenopathy. Does not bruise/bleed easily.  Psychiatric/Behavioral:  Negative for confusion and hallucinations.   All other systems reviewed and are negative.  Current Outpatient Medications on File Prior to Visit  Medication Sig Dispense Refill   calcium carbonate 200 MG capsule Take  250 mg by mouth daily.     doxepin (SINEQUAN) 25 MG capsule TAKE 1 CAPSULE BY MOUTH EVERYDAY AT BEDTIME 30 capsule 0   fexofenadine (ALLEGRA) 180 MG tablet Take  180 mg by mouth daily.     FLUoxetine (PROZAC) 20 MG capsule TAKE 1 CAPSULE BY MOUTH EVERY DAY 30 capsule 0   Multiple Vitamin (MULTIVITAMIN) tablet Take 1 tablet by mouth daily.     Probiotic Product (PROBIOTIC DAILY PO) Take 1 capsule by mouth daily.     No current facility-administered medications on file prior to visit.   Past Medical History:  Diagnosis Date   Allergy    Depression    Eating disorder    as a teenager   Headache(784.0)    PVD (peripheral vascular disease) ([place])    Past Surgical History:  Procedure Laterality Date   COLONOSCOPY  10 years ago    pt doesn't remember.   SKIN CANCER EXCISION     TONSILLECTOMY      Allergies  Allergen Reactions   Codeine     REACTION: nausea    Family History  Problem Relation Age of Onset   Diabetes Mother    Hypertension Mother    Hyperlipidemia Mother    Alcohol abuse Mother    Thyroid disease Mother    COPD Father    Colon cancer Neg Hx    Esophageal cancer Neg Hx    Stomach cancer Neg Hx    Rectal cancer Neg Hx     Social History   Socioeconomic History   Marital status: Widowed    Spouse name: Not on file   Number of children: Not on file   Years of education: Not on file   Highest education level: Not on file  Occupational History   Not on file  Tobacco Use   Smoking status: Former    Types: Cigarettes    Quit date: 07/24/1987    Years since quitting: 35.0   Smokeless tobacco: Never  Vaping Use   Vaping Use: Never used  Substance and Sexual Activity   Alcohol use: Yes    Alcohol/week: 7.0 standard drinks of alcohol    Types: 7 Glasses of wine per week    Comment: 1 glass wine daily per pt   Drug use: No   Sexual activity: Not Currently  Other Topics Concern   Not on file  Social History Narrative   Not on file   Social Determinants of Health   Financial Resource Strain: Not on file  Food Insecurity: Not on file  Transportation Needs: Not on file  Physical Activity: Not on file   Stress: Not on file  Social Connections: Not on file   Vitals:   08/14/22 0716  BP: 120/70  Pulse: 85  Resp: 16  Temp: 98.3 F (36.8 C)  SpO2: 97%  Body mass index is 27.39 kg/m. Wt Readings from Last 3 Encounters:  08/14/22 167 lb 2 oz (75.8 kg)  05/22/21 161 lb (73 kg)  02/17/21 170 lb 1.6 oz (77.2 kg)  Physical Exam Vitals and nursing note reviewed.  Constitutional:      General: She is not in acute distress.    Appearance: She is well-developed.  HENT:     Head: Normocephalic and atraumatic.     Right Ear: Hearing, tympanic membrane, ear canal and external ear normal.     Left Ear: Hearing, tympanic membrane, ear canal and external ear normal.     Mouth/Throat:  Mouth: Mucous membranes are moist.     Pharynx: Oropharynx is clear. Uvula midline.  Eyes:     Conjunctiva/sclera: Conjunctivae normal.     Pupils: Pupils are equal, round, and reactive to light.  Neck:     Thyroid: No thyromegaly.     Trachea: No tracheal deviation.  Cardiovascular:     Rate and Rhythm: Normal rate and regular rhythm.     Pulses:          Dorsalis pedis pulses are 2+ on the right side and 2+ on the left side.     Heart sounds: No murmur heard. Pulmonary:     Effort: Pulmonary effort is normal. No respiratory distress.     Breath sounds: Normal breath sounds.  Abdominal:     Palpations: Abdomen is soft. There is no hepatomegaly or mass.     Tenderness: There is no abdominal tenderness.  Musculoskeletal:     Right hip: Tenderness present. No bony tenderness. Normal range of motion.       Legs:     Comments: No signs of synovitis appreciated.  Lymphadenopathy:     Cervical: No cervical adenopathy.     Upper Body:     Right upper body: No supraclavicular adenopathy.     Left upper body: No supraclavicular adenopathy.  Skin:    General: Skin is warm.     Findings: No erythema or rash.  Neurological:     General: No focal deficit present.     Mental Status: She is alert and  oriented to person, place, and time.     Cranial Nerves: No cranial nerve deficit.     Coordination: Coordination normal.     Gait: Gait normal.     Deep Tendon Reflexes:     Reflex Scores:      Bicep reflexes are 2+ on the right side and 2+ on the left side.      Patellar reflexes are 2+ on the right side and 2+ on the left side. Psychiatric:     Comments: Well groomed, good eye contact.   ASSESSMENT AND PLAN: Ms. Katie Fischer was here today annual physical examination.  Lab Results  Component Value Date   [id number] 0,981 (H) 08/14/2022   Lab Results  Component Value Date   WBC 5.8 08/14/2022   HGB 13.5 08/14/2022   HCT 41.6 08/14/2022   MCV 89.5 08/14/2022   PLT 318.0 08/14/2022   Lab Results  Component Value Date   WBC 5.8 08/14/2022   HGB 13.5 08/14/2022   HCT 41.6 08/14/2022   MCV 89.5 08/14/2022   PLT 318.0 08/14/2022   Lab Results  Component Value Date   CREATININE 0.54 08/14/2022   BUN 12 08/14/2022   NA 139 08/14/2022   K 3.8 08/14/2022   CL 102 08/14/2022   CO2 31 08/14/2022   Lab Results  Component Value Date   ALT 23 08/14/2022   AST 24 08/14/2022   ALKPHOS 55 08/14/2022   BILITOT 0.4 08/14/2022   Lab Results  Component Value Date   TSH 3.73 08/14/2022   Routine general medical examination at a health care facility Assessment & Plan: We discussed the importance of regular physical activity and healthy diet for prevention of chronic illness and/or complications. Preventive guidelines reviewed. Vaccination up to date. Ca++ and vit D supplementation to continue. Next CPE in a year.   B12 deficiency Assessment & Plan: Continue current dose of B12 supplementation. Further recommendation will be given according to B12  results.  Orders: -     Vitamin B12; Future  Chronic fatigue Assessment & Plan: Some of her chronic medical conditions can be contributing factors. Further recommendation will be given according to lab results. If problem  is persistent and we cannot identify possible causes, it would be appropriate to arrange for sleep study.  Orders: -     Comprehensive metabolic panel; Future -     CBC; Future -     TSH; Future  Osteopenia of neck of left femur Assessment & Plan: Continue calcium and vitamin D supplementation as well as fall prevention. Regular physical activity that involves bear weight and exercises a couple times per week also recommended. DEXA will be arranged.  Orders: -     DG Bone Density; Future  Screening for lipid disorders -     Lipid panel; Future  Depression, major, recurrent, in partial remission (HCC) Assessment & Plan: Continue fluoxetine 20 mg daily and doxepin 25 mg daily at bedtime. As far as problem is stable, it is appropriate to continue annual follow-up.   Hip pain, right Assessment & Plan: We discussed possible etiologies. Examination today suggest trochanteric bursitis.  We discussed treatment options, she prefers to hold on steroid injection or PT. Local ice and stretching exercises may help. Follow-up as needed.   Insomnia, unspecified type Assessment & Plan: Problem has improved some. Continue doxepin 25 mg daily at bedtime. Continue adequate sleep hygiene.   Return in 1 year (on 08/15/2023) for CPE, chronic problems.  Rosslyn Pasion G. Martin[place], MD  [facility]. [place] office.

## 2022-08-14 ENCOUNTER — Encounter: Payer: Self-pay | Admitting: Family Medicine

## 2022-08-14 ENCOUNTER — Ambulatory Visit (INDEPENDENT_AMBULATORY_CARE_PROVIDER_SITE_OTHER): Payer: 59 | Admitting: Family Medicine

## 2022-08-14 VITALS — BP 120/70 | HR 85 | Temp 98.3°F | Resp 16 | Ht 65.5 in | Wt 167.1 lb

## 2022-08-14 DIAGNOSIS — R5382 Chronic fatigue, unspecified: Secondary | ICD-10-CM

## 2022-08-14 DIAGNOSIS — Z0000 Encounter for general adult medical examination without abnormal findings: Secondary | ICD-10-CM

## 2022-08-14 DIAGNOSIS — G4700 Insomnia, unspecified: Secondary | ICD-10-CM

## 2022-08-14 DIAGNOSIS — E538 Deficiency of other specified B group vitamins: Secondary | ICD-10-CM

## 2022-08-14 DIAGNOSIS — F3341 Major depressive disorder, recurrent, in partial remission: Secondary | ICD-10-CM | POA: Diagnosis not present

## 2022-08-14 DIAGNOSIS — M85852 Other specified disorders of bone density and structure, left thigh: Secondary | ICD-10-CM | POA: Diagnosis not present

## 2022-08-14 DIAGNOSIS — Z13220 Encounter for screening for lipoid disorders: Secondary | ICD-10-CM

## 2022-08-14 DIAGNOSIS — M25551 Pain in right hip: Secondary | ICD-10-CM

## 2022-08-14 LAB — COMPREHENSIVE METABOLIC PANEL
ALT: 23 U/L (ref 0–35)
AST: 24 U/L (ref 0–37)
Albumin: 4 g/dL (ref 3.5–5.2)
Alkaline Phosphatase: 55 U/L (ref 39–117)
BUN: 12 mg/dL (ref 6–23)
CO2: 31 mEq/L (ref 19–32)
Calcium: 8.9 mg/dL (ref 8.4–10.5)
Chloride: 102 mEq/L (ref 96–112)
Creatinine, Ser: 0.54 mg/dL (ref 0.40–1.20)
GFR: 95.41 mL/min (ref >60.00)
Glucose, Bld: 85 mg/dL (ref 70–99)
Potassium: 3.8 mEq/L (ref 3.5–5.1)
Sodium: 139 mEq/L (ref 135–145)
Total Bilirubin: 0.4 mg/dL (ref 0.2–1.2)
Total Protein: 6.3 g/dL (ref 6.0–8.3)

## 2022-08-14 LAB — LIPID PANEL
Cholesterol: 164 mg/dL (ref 0–200)
HDL: 57.9 mg/dL (ref >39.00)
LDL Cholesterol: 89 mg/dL (ref 0–99)
NonHDL: 106.27
Total CHOL/HDL Ratio: 3
Triglycerides: 86 mg/dL (ref 0.0–149.0)
VLDL: 17.2 mg/dL (ref 0.0–40.0)

## 2022-08-14 LAB — CBC
HCT: 41.6 % (ref 36.0–46.0)
Hemoglobin: 13.5 g/dL (ref 12.0–15.0)
MCHC: 32.4 g/dL (ref 30.0–36.0)
MCV: 89.5 fl (ref 78.0–100.0)
Platelets: 318 10*3/uL (ref 150.0–400.0)
RBC: 4.65 Mil/uL (ref 3.87–5.11)
RDW: 14.7 % (ref 11.5–15.5)
WBC: 5.8 10*3/uL (ref 4.0–10.5)

## 2022-08-14 LAB — TSH: TSH: 3.73 u[IU]/mL (ref 0.35–5.50)

## 2022-08-14 LAB — VITAMIN B12: Vitamin B-12: 1377 pg/mL — ABNORMAL HIGH (ref 211–911)

## 2022-08-14 NOTE — Assessment & Plan Note (Signed)
Continue calcium and vitamin D supplementation as well as fall prevention. Regular physical activity that involves bear weight and exercises a couple times per week also recommended. DEXA will be arranged.

## 2022-08-14 NOTE — Assessment & Plan Note (Signed)
We discussed the importance of regular physical activity and healthy diet for prevention of chronic illness and/or complications. Preventive guidelines reviewed. Vaccination up to date. Ca++ and vit D supplementation to continue. Next CPE in a year. 

## 2022-08-14 NOTE — Patient Instructions (Addendum)
A few things to remember from today's visit:  Routine general medical examination at a health care facility  B12 deficiency - Plan: Vitamin B12  Chronic fatigue - Plan: Comprehensive metabolic panel, CBC, TSH  Osteopenia, unspecified location - Plan: DEXAScan  Screening for lipid disorders - Plan: Lipid panel  Depression, major, recurrent, in partial remission ([place]), Chronic Preventive Care 65 Years and Older, Female Preventive care refers to lifestyle choices and visits with your health care provider that can promote health and wellness. Preventive care visits are also called wellness exams. What can I expect for my preventive care visit? Counseling Your health care provider may ask you questions about your: Medical history, including: Past medical problems. Family medical history. Pregnancy and menstrual history. History of falls. Current health, including: Memory and ability to understand (cognition). Emotional well-being. Home life and relationship well-being. Sexual activity and sexual health. Lifestyle, including: Alcohol, nicotine or tobacco, and drug use. Access to firearms. Diet, exercise, and sleep habits. Work and work Statistician. Sunscreen use. Safety issues such as seatbelt and bike helmet use. Physical exam Your health care provider will check your: Height and weight. These may be used to calculate your BMI (body mass index). BMI is a measurement that tells if you are at a healthy weight. Waist circumference. This measures the distance around your waistline. This measurement also tells if you are at a healthy weight and may help predict your risk of certain diseases, such as type 2 diabetes and high blood pressure. Heart rate and blood pressure. Body temperature. Skin for abnormal spots. What immunizations do I need?  Vaccines are usually given at various ages, according to a schedule. Your health care provider will recommend vaccines for you based on your  age, medical history, and lifestyle or other factors, such as travel or where you work. What tests do I need? Screening Your health care provider may recommend screening tests for certain conditions. This may include: Lipid and cholesterol levels. Hepatitis C test. Hepatitis B test. HIV (human immunodeficiency virus) test. STI (sexually transmitted infection) testing, if you are at risk. Lung cancer screening. Colorectal cancer screening. Diabetes screening. This is done by checking your blood sugar (glucose) after you have not eaten for a while (fasting). Mammogram. Talk with your health care provider about how often you should have regular mammograms. BRCA-related cancer screening. This may be done if you have a family history of breast, ovarian, tubal, or peritoneal cancers. Bone density scan. This is done to screen for osteoporosis. Talk with your health care provider about your test results, treatment options, and if necessary, the need for more tests. Follow these instructions at home: Eating and drinking  Eat a diet that includes fresh fruits and vegetables, whole grains, lean protein, and low-fat dairy products. Limit your intake of foods with high amounts of sugar, saturated fats, and salt. Take vitamin and mineral supplements as recommended by your health care provider. Do not drink alcohol if your health care provider tells you not to drink. If you drink alcohol: Limit how much you have to 0-1 drink a day. Know how much alcohol is in your drink. In the [place] one drink equals one 12 oz bottle of beer (355 mL), one 5 oz glass of wine (148 mL), or one 1 oz glass of hard liquor (44 mL). Lifestyle Brush your teeth every morning and night with fluoride toothpaste. Floss one time each day. Exercise for at least 30 minutes 5 or more days each week. Do not use  any products that contain nicotine or tobacco. These products include cigarettes, chewing tobacco, and vaping devices, such as  e-cigarettes. If you need help quitting, ask your health care provider. Do not use drugs. If you are sexually active, practice safe sex. Use a condom or other form of protection in order to prevent STIs. Take aspirin only as told by your health care provider. Make sure that you understand how much to take and what form to take. Work with your health care provider to find out whether it is safe and beneficial for you to take aspirin daily. Ask your health care provider if you need to take a cholesterol-lowering medicine (statin). Find healthy ways to manage stress, such as: Meditation, yoga, or listening to music. Journaling. Talking to a trusted person. Spending time with friends and family. Minimize exposure to UV radiation to reduce your risk of skin cancer. Safety Always wear your seat belt while driving or riding in a vehicle. Do not drive: If you have been drinking alcohol. Do not ride with someone who has been drinking. When you are tired or distracted. While texting. If you have been using any mind-altering substances or drugs. Wear a helmet and other protective equipment during sports activities. If you have firearms in your house, make sure you follow all gun safety procedures. What's next? Visit your health care provider once a year for an annual wellness visit. Ask your health care provider how often you should have your eyes and teeth checked. Stay up to date on all vaccines. This information is not intended to replace advice given to you by your health care provider. Make sure you discuss any questions you have with your health care provider. Document Revised: 01/04/2021 Document Reviewed: 01/04/2021 Elsevier Patient Education  [facility] not use My Chart to request refills or for acute issues that need immediate attention. If you send a my chart message, it may take a few days to be addressed, specially if I am not in the office.  Please be sure medication  list is accurate. If a new problem present, please set up appointment sooner than planned today.

## 2022-08-14 NOTE — Assessment & Plan Note (Signed)
Continue fluoxetine 20 mg daily and doxepin 25 mg daily at bedtime. As far as problem is stable, it is appropriate to continue annual follow-up.

## 2022-08-14 NOTE — Assessment & Plan Note (Signed)
Problem has improved some. Continue doxepin 25 mg daily at bedtime. Continue adequate sleep hygiene.

## 2022-08-14 NOTE — Assessment & Plan Note (Signed)
Continue current dose of B12 supplementation. Further recommendation will be given according to B12 results.

## 2022-08-14 NOTE — Assessment & Plan Note (Signed)
Some of her chronic medical conditions can be contributing factors. Further recommendation will be given according to lab results. If problem is persistent and we cannot identify possible causes, it would be appropriate to arrange for sleep study.

## 2022-08-14 NOTE — Assessment & Plan Note (Signed)
We discussed possible etiologies. Examination today suggest trochanteric bursitis.  We discussed treatment options, she prefers to hold on steroid injection or PT. Local ice and stretching exercises may help. Follow-up as needed.

## 2022-08-20 ENCOUNTER — Other Ambulatory Visit: Payer: Self-pay | Admitting: Family Medicine

## 2022-08-20 DIAGNOSIS — F3341 Major depressive disorder, recurrent, in partial remission: Secondary | ICD-10-CM

## 2022-08-20 DIAGNOSIS — G4700 Insomnia, unspecified: Secondary | ICD-10-CM

## 2022-08-20 DIAGNOSIS — F419 Anxiety disorder, unspecified: Secondary | ICD-10-CM

## 2023-01-04 ENCOUNTER — Telehealth: Payer: Self-pay | Admitting: Family Medicine

## 2023-01-04 NOTE — Telephone Encounter (Signed)
Pt is requesting booster vaccines (Tdap, Hepatitis, and Polio) because she is traveling to [place] in a few months.  Pt will be here on Monday, 01/14/23.

## 2023-01-11 NOTE — Progress Notes (Unsigned)
   ACUTE VISIT No chief complaint on file.  HPI: Ms.Evolet Veazie is a 67 y.o. female, who is here today complaining of *** HPI  Review of Systems See other pertinent positives and negatives in HPI.  Current Outpatient Medications on File Prior to Visit  Medication Sig Dispense Refill   calcium carbonate 200 MG capsule Take 250 mg by mouth daily.     doxepin (SINEQUAN) 25 MG capsule TAKE 1 CAPSULE BY MOUTH EVERYDAY AT BEDTIME 90 capsule 3   fexofenadine (ALLEGRA) 180 MG tablet Take 180 mg by mouth daily.     FLUoxetine (PROZAC) 20 MG capsule TAKE 1 CAPSULE BY MOUTH EVERY DAY 90 capsule 3   Multiple Vitamin (MULTIVITAMIN) tablet Take 1 tablet by mouth daily.     Probiotic Product (PROBIOTIC DAILY PO) Take 1 capsule by mouth daily.     No current facility-administered medications on file prior to visit.    Past Medical History:  Diagnosis Date   Allergy    Depression    Eating disorder    as a teenager   Headache(784.0)    PVD (peripheral vascular disease) (HCC)    Allergies  Allergen Reactions   Codeine     REACTION: nausea    Social History   Socioeconomic History   Marital status: Widowed    Spouse name: Not on file   Number of children: Not on file   Years of education: Not on file   Highest education level: Not on file  Occupational History   Not on file  Tobacco Use   Smoking status: Former    Types: Cigarettes    Quit date: 07/24/1987    Years since quitting: 35.4   Smokeless tobacco: Never  Vaping Use   Vaping Use: Never used  Substance and Sexual Activity   Alcohol use: Yes    Alcohol/week: 7.0 standard drinks of alcohol    Types: 7 Glasses of wine per week    Comment: 1 glass wine daily per pt   Drug use: No   Sexual activity: Not Currently  Other Topics Concern   Not on file  Social History Narrative   Not on file   Social Determinants of Health   Financial Resource Strain: Not on file  Food Insecurity: Not on file  Transportation Needs:  Not on file  Physical Activity: Not on file  Stress: Not on file  Social Connections: Not on file    There were no vitals filed for this visit. There is no height or weight on file to calculate BMI.  Physical Exam  ASSESSMENT AND PLAN: There are no diagnoses linked to this encounter.  No follow-ups on file.  Hershey Knauer G. [place], MD  [facility]. [place] office.  Discharge Instructions   None

## 2023-01-14 ENCOUNTER — Ambulatory Visit (INDEPENDENT_AMBULATORY_CARE_PROVIDER_SITE_OTHER): Payer: Medicare HMO | Admitting: Family Medicine

## 2023-01-14 ENCOUNTER — Encounter: Payer: Self-pay | Admitting: Family Medicine

## 2023-01-14 VITALS — BP 120/80 | HR 75 | Temp 98.5°F | Resp 16 | Ht 65.5 in | Wt 166.2 lb

## 2023-01-14 DIAGNOSIS — Z23 Encounter for immunization: Secondary | ICD-10-CM

## 2023-01-14 DIAGNOSIS — G4700 Insomnia, unspecified: Secondary | ICD-10-CM

## 2023-01-14 DIAGNOSIS — Z7184 Encounter for health counseling related to travel: Secondary | ICD-10-CM

## 2023-01-14 DIAGNOSIS — M19041 Primary osteoarthritis, right hand: Secondary | ICD-10-CM | POA: Insufficient documentation

## 2023-01-14 MED ORDER — AZITHROMYCIN 500 MG PO TABS: ORAL_TABLET | ORAL | 0 refills | Status: AC

## 2023-01-14 MED ORDER — ATOVAQUONE-PROGUANIL HCL 250-100 MG PO TABS: ORAL_TABLET | ORAL | 0 refills | Status: DC

## 2023-01-14 NOTE — Patient Instructions (Addendum)
A few things to remember from today's visit:  Osteoarthritis of right hand, unspecified osteoarthritis type  Counseling about travel - Plan: atovaquone-proguanil (MALARONE) 250-100 MG TABS tablet, azithromycin (ZITHROMAX) 500 MG tablet  Insomnia, unspecified type  Try Dxepin 25 mg every other day for 2 weeks then every 3rd day for 10 days then stop. If any problem we can decrease Doxepin to 10 mg and totrate down.  If you need refills for medications you take chronically, please call your pharmacy. Do not use My Chart to request refills or for acute issues that need immediate attention. If you send a my chart message, it may take a few days to be addressed, specially if I am not in the office.  Please be sure medication list is accurate. If a new problem present, please set up appointment sooner than planned today.

## 2023-01-14 NOTE — Assessment & Plan Note (Signed)
We discussed diagnosis, prognosis, treatment options, differential diagnosis. I do not think blood work is needed at this time. Doxepin can be considered later on if more joints are affected.

## 2023-01-14 NOTE — Assessment & Plan Note (Signed)
Since her last visit she has retired, so she would like to stop doxepin. She will try doxepin 25 mg every other day and continue weaning off as tolerated, if she has any problem with withdrawal symptoms, we can decrease doxepin from 25 mg to 10 mg and titrate down as tolerated. Continue good sleep hygiene.

## 2023-03-04 DIAGNOSIS — G4700 Insomnia, unspecified: Secondary | ICD-10-CM | POA: Diagnosis not present

## 2023-03-04 DIAGNOSIS — F325 Major depressive disorder, single episode, in full remission: Secondary | ICD-10-CM | POA: Diagnosis not present

## 2023-03-04 DIAGNOSIS — Z87891 Personal history of nicotine dependence: Secondary | ICD-10-CM | POA: Diagnosis not present

## 2023-03-04 DIAGNOSIS — Z973 Presence of spectacles and contact lenses: Secondary | ICD-10-CM | POA: Diagnosis not present

## 2023-03-04 DIAGNOSIS — Z8249 Family history of ischemic heart disease and other diseases of the circulatory system: Secondary | ICD-10-CM | POA: Diagnosis not present

## 2023-03-04 DIAGNOSIS — Z008 Encounter for other general examination: Secondary | ICD-10-CM | POA: Diagnosis not present

## 2023-05-15 ENCOUNTER — Ambulatory Visit (INDEPENDENT_AMBULATORY_CARE_PROVIDER_SITE_OTHER): Payer: Medicare HMO | Admitting: Family Medicine

## 2023-05-15 ENCOUNTER — Encounter: Payer: Self-pay | Admitting: Family Medicine

## 2023-05-15 VITALS — BP 120/78 | HR 80 | Resp 16 | Ht 65.5 in | Wt 161.0 lb

## 2023-05-15 DIAGNOSIS — Z23 Encounter for immunization: Secondary | ICD-10-CM

## 2023-05-15 DIAGNOSIS — M722 Plantar fascial fibromatosis: Secondary | ICD-10-CM

## 2023-05-15 NOTE — Progress Notes (Signed)
ACUTE VISIT Chief Complaint  Patient presents with   Foot Pain    Ongoing x a month, right heel   HPI: KatieKatie Fischer is a 67 y.o. female with a PMHx significant for seasonal allergies, chronic fatigue, B12 deficiency, depression OA, osteoporosis, and insomnia, who is here today complaining of right foot pain as described above.   She complains of new onset of right heel achy pain for about a month.   She rates the pain as a 5-6/10. She says it is worse in the morning when she first start walking but present through the days.  It is stable.  She denies noticing any redness, local edema,or skin lesions.  Negative for recent injuries,unusual activities, or changes in shoe wear. She has been taking motrin for her pain.  She mentions she has been going to the gym frequently, and would like to continue, she asks whether this is aggravate problem.  Review of Systems  Constitutional:  Negative for appetite change, chills and fever.  Respiratory:  Negative for shortness of breath.   Cardiovascular:  Negative for leg swelling.  Gastrointestinal:  Negative for abdominal pain and nausea.  Musculoskeletal:  Positive for gait problem. Negative for joint swelling.  Skin:  Negative for wound.  Neurological:  Negative for weakness and numbness.  See other pertinent positives and negatives in HPI.  Current Outpatient Medications on File Prior to Visit  Medication Sig Dispense Refill   calcium carbonate 200 MG capsule Take 250 mg by mouth daily.     doxepin (SINEQUAN) 25 MG capsule TAKE 1 CAPSULE BY MOUTH EVERYDAY AT BEDTIME 90 capsule 3   fexofenadine (ALLEGRA) 180 MG tablet Take 180 mg by mouth daily.     FLUoxetine (PROZAC) 20 MG capsule TAKE 1 CAPSULE BY MOUTH EVERY DAY 90 capsule 3   Multiple Vitamin (MULTIVITAMIN) tablet Take 1 tablet by mouth daily.     Probiotic Product (PROBIOTIC DAILY PO) Take 1 capsule by mouth daily.     No current facility-administered medications on file prior  to visit.   Past Medical History:  Diagnosis Date   Allergy    Depression    Eating disorder    as a teenager   Headache(784.0)    PVD (peripheral vascular disease) (HCC)    Allergies  Allergen Reactions   Codeine     REACTION: nausea    Social History   Socioeconomic History   Marital status: Widowed    Spouse name: Not on file   Number of children: Not on file   Years of education: Not on file   Highest education level: Bachelor's degree (e.g., BA, AB, BS)  Occupational History   Not on file  Tobacco Use   Smoking status: Former    Current packs/day: 0.00    Types: Cigarettes    Quit date: 07/24/1987    Years since quitting: 35.8   Smokeless tobacco: Never  Vaping Use   Vaping status: Never Used  Substance and Sexual Activity   Alcohol use: Yes    Alcohol/week: 7.0 standard drinks of alcohol    Types: 7 Glasses of wine per week    Comment: 1 glass wine daily per pt   Drug use: No   Sexual activity: Not Currently  Other Topics Concern   Not on file  Social History Narrative   Not on file   Social Determinants of Health   Financial Resource Strain: Low Risk  (05/14/2023)   Overall Financial Resource Strain (CARDIA)  Difficulty of Paying Living Expenses: Not hard at all  Food Insecurity: No Food Insecurity (05/14/2023)   Hunger Vital Sign    Worried About Running Out of Food in the Last Year: Never true    Ran Out of Food in the Last Year: Never true  Transportation Needs: No Transportation Needs (01/13/2023)   PRAPARE - Administrator, Civil Service (Medical): No    Lack of Transportation (Non-Medical): No  Physical Activity: Sufficiently Active (05/14/2023)   Exercise Vital Sign    Days of Exercise per Week: 6 days    Minutes of Exercise per Session: 60 min  Stress: No Stress Concern Present (05/14/2023)   [employer] of Occupational Health - Occupational Stress Questionnaire    Feeling of Stress : Only a little  Social Connections:  Moderately Integrated (05/14/2023)   Social Connection and Isolation Panel [NHANES]    Frequency of Communication with Friends and Family: More than three times a week    Frequency of Social Gatherings with Friends and Family: More than three times a week    Attends Religious Services: 1 to 4 times per year    Active Member of [employer] or Organizations: Yes    Attends Banker Meetings: More than 4 times per year    Marital Status: Widowed    Vitals:   05/15/23 1542  BP: 120/78  Pulse: 80  Resp: 16  SpO2: 96%   Body mass index is 26.38 kg/m.  Physical Exam Vitals and nursing note reviewed.  Constitutional:      General: She is not in acute distress.    Appearance: She is well-developed.  HENT:     Head: Normocephalic and atraumatic.  Eyes:     Conjunctiva/sclera: Conjunctivae normal.  Cardiovascular:     Rate and Rhythm: Normal rate and regular rhythm.     Pulses:          Dorsalis pedis pulses are 2+ on the right side and 2+ on the left side.  Pulmonary:     Effort: Pulmonary effort is normal. No respiratory distress.  Musculoskeletal:     Right ankle:     Right Achilles Tendon: No tenderness or defects. Thompson's test negative.     Right foot: Normal capillary refill. Tenderness present. No bony tenderness or crepitus. Normal pulse.     Comments: Right foot:Tenderness upon palpation of heel mainly at the medial insertion of plantar fascia into calcaneous. Also mild discomfort with palpation along planta fascia towards forefoot. small bunion  Dorsal flexion of first MTP elicits elicits pain.  No edema or erythema appreciated on area.   Skin:    General: Skin is warm.     Findings: No erythema or rash.  Neurological:     Mental Status: She is alert and oriented to person, place, and time.     Comments: Antalgic gait, not assisted.  Psychiatric:        Mood and Affect: Mood and affect normal.   ASSESSMENT AND PLAN:  Katie Fischer was seen today for right  foot pain.   Plantar fasciitis We discussed diagnosis, prognosis, and treatment options. Recommend stretching exercises, comfortable shoe wear, shoe inserts, and night time splint. Continue OTC Motrin 400 mg 3 times daily with food as needed. I do not think imaging is needed at this time. If problem is not greatly improved, she will need to see a podiatrist to discuss other treatment options. Continue daily activities, including exercise.  As tolerated.  Need  for influenza vaccination -     Flu Vaccine Trivalent High Dose (Fluad)  Return if symptoms worsen or fail to improve.  I, Rolla Etienne Wierda, acting as a scribe for Jayanth Szczesniak [place], MD., have documented all relevant documentation on the behalf of Aundray Cartlidge [place], MD, as directed by  Chao Blazejewski [place], MD while in the presence of Diedra Sinor [place], MD.   I, Aarya Robinson [place], MD, have reviewed all documentation for this visit. The documentation on 05/15/23 for the exam, diagnosis, procedures, and orders are all accurate and complete.  Macallan Ord G. [place], MD  [facility]. [place] office.

## 2023-05-15 NOTE — Patient Instructions (Addendum)
A few things to remember from today's visit:  Plantar fasciitis  If you need refills for medications you take chronically, please call your pharmacy. Do not use My Chart to request refills or for acute issues that need immediate attention. If you send a my chart message, it may take a few days to be addressed, specially if I am not in the office.  Please be sure medication list is accurate. If a new problem present, please set up appointment sooner than planned today.

## 2023-06-17 DIAGNOSIS — Z1231 Encounter for screening mammogram for malignant neoplasm of breast: Secondary | ICD-10-CM | POA: Diagnosis not present

## 2023-06-17 LAB — HM MAMMOGRAPHY

## 2023-06-18 ENCOUNTER — Encounter: Payer: Self-pay | Admitting: Family Medicine

## 2023-08-14 NOTE — Progress Notes (Signed)
HPI: Katie Fischer is a 68 y.o. female with a PMHx significant for insomnia, anxiety, depression, osteopenia, OA, B12 deficiency, and seasonal allergies, who is here today for her routine physical and follow up.  Last CPE: 08/14/2022  Exercise: Patient states she takes exercise classes at the [facility] 5-6 days per week.  Diet: She says she is eating healthy in general and eating vegetables daily.  Sleep: 6-7 hours per night.  Alcohol Use: 1-2 glasses of wine per day.  Smoking: She quit smoking in 1989.  Vision: UTD on routine vision care.  Dental: UTD on routine dental care.   Immunization History  Administered Date(s) Administered   Fluad Trivalent(High Dose 65+) 05/15/2023   Hepatitis A, Adult 01/14/2023   IPV 01/14/2023   Influenza,inj,Quad PF,6+ Mos 04/14/2013, 05/20/2020   Influenza-Unspecified 06/06/2014, 03/01/2017, 05/23/2018, 05/04/2019, 02/20/2021, 05/29/2022   MMR 01/14/2023   Moderna Covid-19 Fall Seasonal Vaccine 67yrs & older 02/08/2023   PFIZER Comirnaty(Gray Top)Covid-19 Tri-Sucrose Vaccine 02/05/2021   PFIZER(Purple Top)SARS-COV-2 Vaccination 09/26/2019, 10/20/2019, 05/20/2020   PNEUMOCOCCAL CONJUGATE-20 11/20/2020   Pfizer Covid-19 Vaccine Bivalent Booster 76yrs & up 05/22/2021   Td 04/11/2009   Tdap 01/21/2016   Zoster Recombinant(Shingrix) 12/13/2016, 03/01/2018, 05/22/2021   Health Maintenance  Topic Date Due   Medicare Annual Wellness (AWV)  08/28/2023 (Originally 03-Apr-1956)   COVID-19 Vaccine (7 - 2024-25 season) 09/01/2023 (Originally 04/05/2023)   MAMMOGRAM  06/16/2025   DTaP/Tdap/Td (3 - Td or Tdap) 01/20/2026   Colonoscopy  03/16/2027   Pneumonia Vaccine 38+ Years old  Completed   INFLUENZA VACCINE  Completed   DEXA SCAN  Completed   Hepatitis C Screening  Completed   Zoster Vaccines- Shingrix  Completed   HPV VACCINES  Aged Out   Chronic medical problems:   Osteopenia:  Dexa scan in 2016 showed low bone density in her hip.  She is taking  Calcium with vitamin D.   Insomnia:  Currently on doxepin 25 mg at bedtime prn.  Tolerating medication well, no side effects reported.  Anxiety:  Currently on fluoxetine 20 mg daily. She feels like the medication is helping.     08/16/2023    7:30 AM 01/14/2023    8:04 AM 08/14/2022    7:24 AM 05/22/2021    8:05 AM 09/02/2020    7:56 AM  Depression screen PHQ 2/9  Decreased Interest 0 0 1 0 2  Down, Depressed, Hopeless 0 0 1 0 2  PHQ - 2 Score 0 0 2 0 4  Altered sleeping 1 0 1 3 3   Tired, decreased energy 1 0 2 1 3   Change in appetite 1 0 1 1 3   Feeling bad or failure about yourself  0 0 1 0 2  Trouble concentrating 0 0 1 0 2  Moving slowly or fidgety/restless 0 0 0 0 0  Suicidal thoughts 0 0 0 0 0  PHQ-9 Score 3 0 8 5 17   Difficult doing work/chores Not difficult at all Not difficult at all Not difficult at all Not difficult at all Somewhat difficult      08/16/2023    7:31 AM 08/14/2022    7:24 AM 05/22/2021    8:14 AM 09/02/2020    7:57 AM  GAD 7 : Generalized Anxiety Score  Nervous, Anxious, on Edge 0 0 0 1  Control/stop worrying 0 0 0 1  Worry too much - different things 0 0 0 1  Trouble relaxing 1 0 1 2  Restless 0 0 1 1  Easily annoyed or irritable 0 1 1 1   Afraid - awful might happen 0 0 0 2  Total GAD 7 Score 1 1 3 9   Anxiety Difficulty Not difficult at all Not difficult at all Not difficult at all Somewhat difficult   B12 deficiency: No longer taking B12 supplementation.  Lab Results  Component Value Date   VITAMINB12 1,377 (H) 08/14/2022   Lab Results  Component Value Date   CHOL 164 08/14/2022   HDL 57.90 08/14/2022   LDLCALC 89 08/14/2022   TRIG 86.0 08/14/2022   CHOLHDL 3 08/14/2022   The 10-year ASCVD risk score (Arnett DK, et al., 2019) is: 6.8%   Values used to calculate the score:     Age: 68 years     Sex: Female     Is Non-Hispanic African American: No     Diabetic: No     Tobacco smoker: No     Systolic Blood Pressure: 126 mmHg     Is  BP treated: No     HDL Cholesterol: 57.9 mg/dL     Total Cholesterol: 164 mg/dL  -She is still having right heel pain, for which she was seen 05/15/23.  She is wearing shoe inserts. Has not seen podiatry.   -She asks about getting a prescription for scopolamine patches because she is going on a 2 weeks cruise shortly.   Review of Systems  Constitutional:  Negative for activity change, appetite change and fever.  HENT:  Negative for mouth sores, sore throat and trouble swallowing.   Eyes:  Negative for redness and visual disturbance.  Respiratory:  Negative for cough, shortness of breath and wheezing.   Cardiovascular:  Negative for chest pain, palpitations and leg swelling.  Gastrointestinal:  Negative for abdominal pain, nausea and vomiting.       No changes in bowel habits.  Endocrine: Negative for cold intolerance, heat intolerance, polydipsia, polyphagia and polyuria.  Genitourinary:  Negative for decreased urine volume, dysuria and hematuria.  Musculoskeletal:  Positive for arthralgias. Negative for gait problem and myalgias.  Skin:  Negative for color change and rash.  Allergic/Immunologic: Positive for environmental allergies.  Neurological:  Negative for syncope, weakness and headaches.  Hematological:  Negative for adenopathy. Does not bruise/bleed easily.  Psychiatric/Behavioral:  Negative for confusion and hallucinations.   All other systems reviewed and are negative.  Current Outpatient Medications on File Prior to Visit  Medication Sig Dispense Refill   calcium carbonate 200 MG capsule Take 250 mg by mouth daily.     fexofenadine (ALLEGRA) 180 MG tablet Take 180 mg by mouth daily.     Multiple Vitamin (MULTIVITAMIN) tablet Take 1 tablet by mouth daily.     Probiotic Product (PROBIOTIC DAILY PO) Take 1 capsule by mouth daily.     doxepin (SINEQUAN) 25 MG capsule TAKE 1 CAPSULE BY MOUTH EVERYDAY AT BEDTIME 90 capsule 3   FLUoxetine (PROZAC) 20 MG capsule TAKE 1 CAPSULE  BY MOUTH EVERY DAY 90 capsule 3   No current facility-administered medications on file prior to visit.   Past Medical History:  Diagnosis Date   Allergy    Depression    Eating disorder    as a teenager   Headache(784.0)    PVD (peripheral vascular disease) (HCC)    Past Surgical History:  Procedure Laterality Date   COLONOSCOPY  10 years ago    pt doesn't remember.   SKIN CANCER EXCISION     TONSILLECTOMY     Allergies  Allergen  Reactions   Codeine     REACTION: nausea    Family History  Problem Relation Age of Onset   Diabetes Mother    Hypertension Mother    Hyperlipidemia Mother    Alcohol abuse Mother    Thyroid disease Mother    COPD Father    Colon cancer Neg Hx    Esophageal cancer Neg Hx    Stomach cancer Neg Hx    Rectal cancer Neg Hx     Social History   Socioeconomic History   Marital status: Widowed    Spouse name: Not on file   Number of children: Not on file   Years of education: Not on file   Highest education level: Bachelor's degree (e.g., BA, AB, BS)  Occupational History   Not on file  Tobacco Use   Smoking status: Former    Current packs/day: 0.00    Types: Cigarettes    Quit date: 07/24/1987    Years since quitting: 36.0   Smokeless tobacco: Never  Vaping Use   Vaping status: Never Used  Substance and Sexual Activity   Alcohol use: Yes    Alcohol/week: 7.0 standard drinks of alcohol    Types: 7 Glasses of wine per week    Comment: 1 glass wine daily per pt   Drug use: No   Sexual activity: Not Currently  Other Topics Concern   Not on file  Social History Narrative   Not on file   Social Drivers of Health   Financial Resource Strain: Low Risk  (08/15/2023)   Overall Financial Resource Strain (CARDIA)    Difficulty of Paying Living Expenses: Not hard at all  Food Insecurity: No Food Insecurity (08/15/2023)   Hunger Vital Sign    Worried About Running Out of Food in the Last Year: Never true    Ran Out of Food in the Last  Year: Never true  Transportation Needs: No Transportation Needs (08/15/2023)   PRAPARE - Administrator, Civil Service (Medical): No    Lack of Transportation (Non-Medical): No  Physical Activity: Sufficiently Active (08/15/2023)   Exercise Vital Sign    Days of Exercise per Week: 5 days    Minutes of Exercise per Session: 60 min  Stress: No Stress Concern Present (08/15/2023)   [employer] of Occupational Health - Occupational Stress Questionnaire    Feeling of Stress : Only a little  Social Connections: Moderately Integrated (08/15/2023)   Social Connection and Isolation Panel [NHANES]    Frequency of Communication with Friends and Family: More than three times a week    Frequency of Social Gatherings with Friends and Family: More than three times a week    Attends Religious Services: More than 4 times per year    Active Member of [employer] or Organizations: Yes    Attends Banker Meetings: More than 4 times per year    Marital Status: Widowed    Today's Vitals   08/16/23 0721  BP: 126/80  Pulse: 77  Resp: 16  Temp: 98.2 F (36.8 C)  TempSrc: Oral  SpO2: 96%  Weight: 154 lb 4 oz (70 kg)  Height: 5' 5.5" (1.664 m)   Body mass index is 25.28 kg/m.  Wt Readings from Last 3 Encounters:  08/16/23 154 lb 4 oz (70 kg)  05/15/23 161 lb (73 kg)  01/14/23 166 lb 4 oz (75.4 kg)   Physical Exam Vitals and nursing note reviewed.  Constitutional:  General: She is not in acute distress.    Appearance: She is well-developed.  HENT:     Head: Normocephalic and atraumatic.     Right Ear: Tympanic membrane, ear canal and external ear normal.     Left Ear: Tympanic membrane, ear canal and external ear normal.     Mouth/Throat:     Mouth: Mucous membranes are moist.     Pharynx: Oropharynx is clear. Uvula midline.  Eyes:     Extraocular Movements: Extraocular movements intact.     Conjunctiva/sclera: Conjunctivae normal.     Pupils: Pupils are equal,  round, and reactive to light.  Neck:     Thyroid: No thyroid mass or thyromegaly.  Cardiovascular:     Rate and Rhythm: Normal rate and regular rhythm.     Pulses:          Dorsalis pedis pulses are 2+ on the right side and 2+ on the left side.     Heart sounds: No murmur heard. Pulmonary:     Effort: Pulmonary effort is normal. No respiratory distress.     Breath sounds: Normal breath sounds.  Abdominal:     Palpations: Abdomen is soft. There is no hepatomegaly or mass.     Tenderness: There is no abdominal tenderness.  Genitourinary:    Comments: No concerns. Musculoskeletal:     Right lower leg: No edema.     Left lower leg: No edema.     Comments: No signs of synovitis appreciated.  Lymphadenopathy:     Cervical: No cervical adenopathy.     Upper Body:     Right upper body: No supraclavicular adenopathy.     Left upper body: No supraclavicular adenopathy.  Skin:    General: Skin is warm.     Findings: No erythema or rash.  Neurological:     General: No focal deficit present.     Mental Status: She is alert and oriented to person, place, and time.     Cranial Nerves: No cranial nerve deficit.     Sensory: No sensory deficit.     Motor: No weakness.     Coordination: Coordination normal.     Gait: Gait normal.     Deep Tendon Reflexes:     Reflex Scores:      Bicep reflexes are 2+ on the right side and 2+ on the left side.      Patellar reflexes are 2+ on the right side and 2+ on the left side. Psychiatric:        Mood and Affect: Mood and affect normal.   ASSESSMENT AND PLAN:  Katie Fischer was here today for her annual physical examination.  Orders Placed This Encounter  Procedures   DG BONE DENSITY (DXA)   Basic metabolic panel   Vitamin B12   VITAMIN D 25 Hydroxy (Vit-D Deficiency, Fractures)   Lab Results  Component Value Date   VD25OH 28.68 (L) 08/16/2023   Lab Results  Component Value Date   VITAMINB12 397 08/16/2023   Lab Results  Component  Value Date   NA 138 08/16/2023   CL 101 08/16/2023   K 4.0 08/16/2023   CO2 30 08/16/2023   BUN 12 08/16/2023   CREATININE 0.56 08/16/2023   GFR 93.91 08/16/2023   CALCIUM 9.2 08/16/2023   ALBUMIN 4.0 08/14/2022   GLUCOSE 95 08/16/2023   Routine general medical examination at a health care facility Assessment & Plan: We discussed the importance of regular physical activity and  healthy diet for prevention of chronic illness and/or complications. Preventive guidelines reviewed. Vaccination up-to-date. Ca++ and vit D supplementation to continue. DEXA will be arranged. Colonoscopy due in 02/2027. Next CPE in a year.   B12 deficiency Assessment & Plan: Currently she is not on B12 supplementation. Further recommendation will be given according to B12 result.  Orders: -     Basic metabolic panel; Future -     Vitamin B12; Future  Osteopenia, unspecified location Assessment & Plan: Last DEXA in 03/2015. Continue adequate calcium and vitamin D supplementation, regular physical activity that involves weightbearing exercises at least 3 times per week, for prevention. DEXA will be scheduled.  Orders: -     VITAMIN D 25 Hydroxy (Vit-D Deficiency, Fractures); Future -     DG Bone Density; Future  H/O motion sickness Assessment & Plan: Scopolamine patches every third day as recommended.  Instructed to apply patch about 3 to 4 hours before boarding the cruise.  Orders: -     Scopolamine; Place 1 patch (1.5 mg total) onto the skin every 3 (three) days for 10 days.  Dispense: 4 patch; Refill: 0  Depression, major, recurrent, in partial remission (HCC) Assessment & Plan: Problem is stable. She would like to continue fluoxetine, currently on 20 mg daily. As far as problem is stable, annual follow-up is appropriate.   Insomnia, unspecified type Assessment & Plan: Stable, still taking Doxepin. Continue doxepin 25 mg daily at bedtime as needed. Continue good sleep hygiene. F/U in  a year, before if needed.   Return in 1 year (on 08/15/2024) for CPE, chronic problems.  I, Rolla Etienne Wierda, acting as a scribe for Jawaun Celmer [place], MD., have documented all relevant documentation on the behalf of Shandon Burlingame [place], MD, as directed by  Laloni Rowton [place], MD while in the presence of Meryle Pugmire [place], MD.   I, Krishauna Schatzman [place], MD, have reviewed all documentation for this visit. The documentation on 08/16/23 for the exam, diagnosis, procedures, and orders are all accurate and complete.  Nafeesa Dils G. [place], MD  [facility]. [place] office.

## 2023-08-15 ENCOUNTER — Other Ambulatory Visit: Payer: Self-pay | Admitting: Family Medicine

## 2023-08-15 DIAGNOSIS — F419 Anxiety disorder, unspecified: Secondary | ICD-10-CM

## 2023-08-15 DIAGNOSIS — F3341 Major depressive disorder, recurrent, in partial remission: Secondary | ICD-10-CM

## 2023-08-15 DIAGNOSIS — G4700 Insomnia, unspecified: Secondary | ICD-10-CM

## 2023-08-16 ENCOUNTER — Encounter: Payer: Self-pay | Admitting: Family Medicine

## 2023-08-16 ENCOUNTER — Other Ambulatory Visit (HOSPITAL_COMMUNITY): Payer: Self-pay

## 2023-08-16 ENCOUNTER — Telehealth: Payer: Self-pay | Admitting: Pharmacy Technician

## 2023-08-16 ENCOUNTER — Ambulatory Visit (INDEPENDENT_AMBULATORY_CARE_PROVIDER_SITE_OTHER): Payer: Medicare HMO | Admitting: Family Medicine

## 2023-08-16 VITALS — BP 126/80 | HR 77 | Temp 98.2°F | Resp 16 | Ht 65.5 in | Wt 154.2 lb

## 2023-08-16 DIAGNOSIS — Z87898 Personal history of other specified conditions: Secondary | ICD-10-CM | POA: Insufficient documentation

## 2023-08-16 DIAGNOSIS — Z0000 Encounter for general adult medical examination without abnormal findings: Secondary | ICD-10-CM | POA: Diagnosis not present

## 2023-08-16 DIAGNOSIS — E538 Deficiency of other specified B group vitamins: Secondary | ICD-10-CM

## 2023-08-16 DIAGNOSIS — G4700 Insomnia, unspecified: Secondary | ICD-10-CM | POA: Diagnosis not present

## 2023-08-16 DIAGNOSIS — F3341 Major depressive disorder, recurrent, in partial remission: Secondary | ICD-10-CM

## 2023-08-16 DIAGNOSIS — M8580 Other specified disorders of bone density and structure, unspecified site: Secondary | ICD-10-CM

## 2023-08-16 LAB — BASIC METABOLIC PANEL
BUN: 12 mg/dL (ref 6–23)
CO2: 30 meq/L (ref 19–32)
Calcium: 9.2 mg/dL (ref 8.4–10.5)
Chloride: 101 meq/L (ref 96–112)
Creatinine, Ser: 0.56 mg/dL (ref 0.40–1.20)
GFR: 93.91 mL/min (ref >60.00)
Glucose, Bld: 95 mg/dL (ref 70–99)
Potassium: 4 meq/L (ref 3.5–5.1)
Sodium: 138 meq/L (ref 135–145)

## 2023-08-16 LAB — VITAMIN D 25 HYDROXY (VIT D DEFICIENCY, FRACTURES): VITD: 28.68 ng/mL — ABNORMAL LOW (ref 30.00–100.00)

## 2023-08-16 LAB — VITAMIN B12: Vitamin B-12: 397 pg/mL (ref 211–911)

## 2023-08-16 MED ORDER — SCOPOLAMINE 1 MG/3DAYS TD PT72
1.0000 | MEDICATED_PATCH | TRANSDERMAL | 0 refills | Status: AC
Start: 2023-08-16 — End: 2023-08-26

## 2023-08-16 NOTE — Assessment & Plan Note (Signed)
Stable, still taking Doxepin. Continue doxepin 25 mg daily at bedtime as needed. Continue good sleep hygiene. F/U in a year, before if needed.

## 2023-08-16 NOTE — Assessment & Plan Note (Signed)
Currently she is not on B12 supplementation. Further recommendation will be given according to B12 result.

## 2023-08-16 NOTE — Assessment & Plan Note (Signed)
Last DEXA in 03/2015. Continue adequate calcium and vitamin D supplementation, regular physical activity that involves weightbearing exercises at least 3 times per week, for prevention. DEXA will be scheduled.

## 2023-08-16 NOTE — Assessment & Plan Note (Signed)
Problem is stable. She would like to continue fluoxetine, currently on 20 mg daily. As far as problem is stable, annual follow-up is appropriate.

## 2023-08-16 NOTE — Assessment & Plan Note (Addendum)
We discussed the importance of regular physical activity and healthy diet for prevention of chronic illness and/or complications. Preventive guidelines reviewed. Vaccination up-to-date. Ca++ and vit D supplementation to continue. DEXA will be arranged. Colonoscopy due in 02/2027. Next CPE in a year.

## 2023-08-16 NOTE — Telephone Encounter (Signed)
Pharmacy Patient Advocate Encounter   Received notification from CoverMyMeds that prior authorization for Scopolamine 1MG /3DAYS 72 hr patches is required/requested.   Insurance verification completed.   The patient is insured through [facility] .   Per test claim: PA required; PA submitted to above mentioned insurance via CoverMyMeds Key/confirmation #/EOC [id number] Status is pending

## 2023-08-16 NOTE — Patient Instructions (Addendum)
A few things to remember from today's visit:  Routine general medical examination at a health care facility  B12 deficiency - Plan: Basic metabolic panel, Vitamin B12  Osteopenia, unspecified location - Plan: VITAMIN D 25 Hydroxy (Vit-D Deficiency, Fractures), DG BONE DENSITY (DXA)  H/O motion sickness - Plan: scopolamine (TRANSDERM-SCOP) 1 MG/3DAYS  Depression, major, recurrent, in partial remission (HCC)  If you need refills for medications you take chronically, please call your pharmacy. Do not use My Chart to request refills or for acute issues that need immediate attention. If you send a my chart message, it may take a few days to be addressed, specially if I am not in the office.  Please be sure medication list is accurate. If a new problem present, please set up appointment sooner than planned today.

## 2023-08-16 NOTE — Assessment & Plan Note (Signed)
Scopolamine patches every third day as recommended.  Instructed to apply patch about 3 to 4 hours before boarding the cruise.

## 2023-08-16 NOTE — Telephone Encounter (Signed)
Pharmacy Patient Advocate Encounter  Received notification from [facility] that Prior Authorization for Scopolamine 1MG /3DAYS 72 hr patches  has been APPROVED from 07/24/23 to 07/22/24   PA #/Case ID/Reference #: [id number]

## 2023-08-22 ENCOUNTER — Ambulatory Visit (INDEPENDENT_AMBULATORY_CARE_PROVIDER_SITE_OTHER): Payer: Medicare HMO

## 2023-08-22 ENCOUNTER — Encounter: Payer: Self-pay | Admitting: Podiatry

## 2023-08-22 ENCOUNTER — Ambulatory Visit (INDEPENDENT_AMBULATORY_CARE_PROVIDER_SITE_OTHER): Payer: Self-pay | Admitting: Podiatry

## 2023-08-22 DIAGNOSIS — M722 Plantar fascial fibromatosis: Secondary | ICD-10-CM

## 2023-08-22 MED ORDER — TRIAMCINOLONE ACETONIDE 40 MG/ML IJ SUSP
20.0000 mg | Freq: Once | INTRAMUSCULAR | Status: AC
  Administered 2023-08-22: 20 mg

## 2023-08-22 MED ORDER — METHYLPREDNISOLONE 4 MG PO TBPK: ORAL_TABLET | ORAL | 0 refills | Status: DC

## 2023-08-22 MED ORDER — MELOXICAM 15 MG PO TABS
15.0000 mg | ORAL_TABLET | Freq: Every day | ORAL | 3 refills | Status: DC
Start: 2023-08-22 — End: 2023-12-13

## 2023-08-22 NOTE — Progress Notes (Signed)
Subjective:  Patient ID: Katie Fischer, female    DOB: [birth year],  MRN: [id number] HPI Chief Complaint  Patient presents with   Foot Pain    Plantar heel right - aching since September, AM pain, seen PCP-dx with PF, gave boot for night, icing, Ibuprofen - some help, but still has quite a bit of pain, trip to [place] tomorrow for 2 weeks   New Patient (Initial Visit)    68 y.o. female presents with the above complaint.   ROS: Denies fever chills nausea vomit muscle aches pains calf pain back pain chest pain shortness of breath.  Past Medical History:  Diagnosis Date   Allergy    Depression    Eating disorder    as a teenager   Headache(784.0)    PVD (peripheral vascular disease) (HCC)    Past Surgical History:  Procedure Laterality Date   COLONOSCOPY  10 years ago    pt doesn't remember.   SKIN CANCER EXCISION     TONSILLECTOMY      Current Outpatient Medications:    calcium carbonate 200 MG capsule, Take 250 mg by mouth daily., Disp: , Rfl:    doxepin (SINEQUAN) 25 MG capsule, TAKE 1 CAPSULE BY MOUTH EVERYDAY AT BEDTIME, Disp: 90 capsule, Rfl: 3   fexofenadine (ALLEGRA) 180 MG tablet, Take 180 mg by mouth daily., Disp: , Rfl:    FLUoxetine (PROZAC) 20 MG capsule, TAKE 1 CAPSULE BY MOUTH EVERY DAY, Disp: 90 capsule, Rfl: 3   Multiple Vitamin (MULTIVITAMIN) tablet, Take 1 tablet by mouth daily., Disp: , Rfl:    Probiotic Product (PROBIOTIC DAILY PO), Take 1 capsule by mouth daily., Disp: , Rfl:    scopolamine (TRANSDERM-SCOP) 1 MG/3DAYS, Place 1 patch (1.5 mg total) onto the skin every 3 (three) days for 10 days., Disp: 4 patch, Rfl: 0  Allergies  Allergen Reactions   Codeine     REACTION: nausea   Review of Systems Objective:  There were no vitals filed for this visit.  General: Well developed, nourished, in no acute distress, alert and oriented x3   Dermatological: Skin is warm, dry and supple bilateral. Nails x 10 are well maintained; remaining integument appears  unremarkable at this time. There are no open sores, no preulcerative lesions, no rash or signs of infection present.  Vascular: Dorsalis Pedis artery and Posterior Tibial artery pedal pulses are 2/4 bilateral with immedate capillary fill time. Pedal hair growth present. No varicosities and no lower extremity edema present bilateral.   Neruologic: Grossly intact via light touch bilateral. Vibratory intact via tuning fork bilateral. Protective threshold with Semmes Wienstein monofilament intact to all pedal sites bilateral. Patellar and Achilles deep tendon reflexes 2+ bilateral. No Babinski or clonus noted bilateral.   Musculoskeletal: No gross boney pedal deformities bilateral. No pain, crepitus, or limitation noted with foot and ankle range of motion bilateral. Muscular strength 5/5 in all groups tested bilateral.  She has pain on palpation medial calcaneal tubercle of her right heel she also has some tenderness along the posterior aspect of the ankle near the posterior tibial tendon and the medial aspect of the Achilles tendon.  Gait: Unassisted, Nonantalgic.    Radiographs:  Radiographs taken today demonstrate an osseously mature individual right foot only.  3 views demonstrates good bone density and good mineralization.  Significant findings really only soft tissue swelling at the plantar fascial calcaneal insertion site and consistent with plantar fasciitis.  Assessment & Plan:   Assessment: Planter fasciitis right foot.  Some  compensatory posterior tibial tendinitis and Achilles tendinitis.  Plan: Discussed etiology pathology conservative surgical therapies I injected the right heel today 20 mg Kenalog 5 mg Marcaine point maximal tenderness.  Tolerated procedure well without complications.  Started on methylprednisolone to be followed by meloxicam.  Placed her in in a plantar fascial brace discussed appropriate shoe gear stretching exercises ice therapy and shoe gear modifications.  I will  follow-up with her in 1 month.  She is leaving in 2 days for [place].     Avionna Bower T. [place], [place]

## 2023-08-22 NOTE — Patient Instructions (Signed)

## 2023-09-13 ENCOUNTER — Ambulatory Visit: Payer: Self-pay | Admitting: Family Medicine

## 2023-09-13 ENCOUNTER — Telehealth: Payer: Medicare HMO | Admitting: Family Medicine

## 2023-09-13 ENCOUNTER — Encounter: Payer: Self-pay | Admitting: Family Medicine

## 2023-09-13 DIAGNOSIS — H109 Unspecified conjunctivitis: Secondary | ICD-10-CM

## 2023-09-13 DIAGNOSIS — R030 Elevated blood-pressure reading, without diagnosis of hypertension: Secondary | ICD-10-CM | POA: Diagnosis not present

## 2023-09-13 DIAGNOSIS — Z791 Long term (current) use of non-steroidal anti-inflammatories (NSAID): Secondary | ICD-10-CM | POA: Diagnosis not present

## 2023-09-13 DIAGNOSIS — G4700 Insomnia, unspecified: Secondary | ICD-10-CM | POA: Diagnosis not present

## 2023-09-13 DIAGNOSIS — Z87891 Personal history of nicotine dependence: Secondary | ICD-10-CM | POA: Diagnosis not present

## 2023-09-13 DIAGNOSIS — Z8249 Family history of ischemic heart disease and other diseases of the circulatory system: Secondary | ICD-10-CM | POA: Diagnosis not present

## 2023-09-13 DIAGNOSIS — F325 Major depressive disorder, single episode, in full remission: Secondary | ICD-10-CM | POA: Diagnosis not present

## 2023-09-13 DIAGNOSIS — M8580 Other specified disorders of bone density and structure, unspecified site: Secondary | ICD-10-CM | POA: Diagnosis not present

## 2023-09-13 DIAGNOSIS — M1990 Unspecified osteoarthritis, unspecified site: Secondary | ICD-10-CM | POA: Diagnosis not present

## 2023-09-13 MED ORDER — POLYMYXIN B-TRIMETHOPRIM 10000-0.1 UNIT/ML-% OP SOLN: 1.0000 [drp] | Freq: Four times a day (QID) | OPHTHALMIC | 0 refills | Status: DC

## 2023-09-13 NOTE — Telephone Encounter (Signed)
 Chief Complaint: Right eye swollen Symptoms: Yellow discharge Frequency: Ongoing Pertinent Negatives: Patient denies itching, pain, fever, blurred vision Disposition: [] ED /[] Urgent Care (no appt availability in office) / [x] Appointment(In office/virtual)/ []  [facility]/ [] Home Care/ [] Refused Recommended Disposition /[] [facility]/ []  Follow-up with PCP Additional Notes: Pt states the right eye swelling started yesterday morning. Virtual OV scheduled for today. This RN educated pt on home care, new-worsening symptoms, when to call back/seek emergent care. Pt verbalized understanding and agrees to plan.   Copied From CRM [id number]. Reason for Triage:  Patient began having symptoms yesterday. R eye and eyelid is swollen and irritated. Not affecting vision Bloodshot red Had upper respiratory infection, but has resolved. No fever. No known exposures to any illnesses   Reason for Disposition  MODERATE-SEVERE eyelid swelling on one side  (Exception: Due to a mosquito bite.)  Answer Assessment - Initial Assessment Questions 1. ONSET: "When did the swelling start?" (e.g., minutes, hours, days)     Yesterday 2. LOCATION: "What part of the eyelids is swollen?"     Right eye and eyelid 3. SEVERITY: "How swollen is it?"     Past two morning it was swollen shut with yellow stuff on it, had to take a washcloth to get it off 4. ITCHING: "Is there any itching?" If Yes, ask: "How much?"   (Scale 1-10; mild, moderate or severe)     Denies 5. PAIN: "Is the swelling painful to touch?" If Yes, ask: "How painful is it?"   (Scale 1-10; mild, moderate or severe)     Denies 6. FEVER: "Do you have a fever?" If Yes, ask: "What is it, how was it measured, and when did it start?"      Denies 7. CAUSE: "What do you think is causing the swelling?"     Not sure 9. OTHER SYMPTOMS: "Do you have any other symptoms?" (e.g., blurred vision, eye discharge, rash, runny nose)     Yellow  discharge in morning  Protocols used: Eye - Swelling-A-AH

## 2023-09-13 NOTE — Patient Instructions (Addendum)
 Bacterial Conjunctivitis, Adult Bacterial conjunctivitis is an infection of the clear membrane that covers the white part of the eye and the inner surface of the eyelid (conjunctiva). When the blood vessels in the conjunctiva become inflamed, the eye becomes red or pink. The eye often feels irritated or itchy. Bacterial conjunctivitis spreads easily from person to person (is contagious). It also spreads easily from one eye to the other eye. What are the causes? This condition is caused by bacteria. You may get the infection if you come into close contact with: A person who is infected with the bacteria. Items that are contaminated with the bacteria, such as a face towel, contact lens solution, or eye makeup. What increases the risk? You are more likely to develop this condition if: You are exposed to other people who have the infection. You wear contact lenses. You have a sinus infection. You have had a recent eye injury or surgery. You have a weak body defense system (immune system). You have a medical condition that causes dry eyes. What are the signs or symptoms? Symptoms of this condition include: Thick, yellowish discharge from the eye. This may turn into a crust on the eyelid overnight and cause your eyelids to stick together. Tearing or watery eyes. Itchy eyes. Burning feeling in your eyes. Eye redness. Swollen eyelids. Blurred vision. How is this diagnosed? This condition is diagnosed based on your symptoms and medical history. Your health care provider may also take a sample of discharge from your eye to find the cause of your infection. How is this treated? This condition may be treated with: Antibiotic eye drops or ointment to clear the infection more quickly and prevent the spread of infection to others. Antibiotic medicines taken by mouth (orally) to treat infections that do not respond to drops or ointments or that last longer than 10 days. Cool, wet cloths (cool  compresses) placed on the eyes. Artificial tears applied 2-6 times a day. Follow these instructions at home: Medicines Take or apply your antibiotic medicine as told by your health care provider. Do not stop using the antibiotic, even if your condition improves, unless directed by your health care provider. Take or apply over-the-counter and prescription medicines only as told by your health care provider. Be very careful to avoid touching the edge of your eyelid with the eye-drop bottle or the ointment tube when you apply medicines to the affected eye. This will keep you from spreading the infection to your other eye or to other people. Managing discomfort Gently wipe away any drainage from your eye with a warm, wet washcloth or a cotton ball. Apply a clean, cool compress to your eye for 10-20 minutes, 3-4 times a day. General instructions Do not wear contact lenses until the inflammation is gone and your health care provider says it is safe to wear them again. Ask your health care provider how to sterilize or replace your contact lenses before you use them again. Wear glasses until you can resume wearing contact lenses. Avoid wearing eye makeup until the inflammation is gone. Throw away any old eye cosmetics that may be contaminated. Change or wash your pillowcase every day. Do not share towels or washcloths. This may spread the infection. Wash your hands often with soap and water for at least 20 seconds and especially before touching your face or eyes. Use paper towels to dry your hands. Avoid touching or rubbing your eyes. Do not drive or use heavy machinery if your vision is blurred. Contact  a health care provider if: You have a fever. Your symptoms do not get better after 10 days. Get help right away if: You have a fever and your symptoms suddenly get worse. You have severe pain when you move your eye. You have facial pain, redness, or swelling. You have a sudden loss of  vision. Summary Bacterial conjunctivitis is an infection of the clear membrane that covers the white part of the eye and the inner surface of the eyelid (conjunctiva). Bacterial conjunctivitis spreads easily from eye to eye and from person to person (is contagious). Wash your hands often with soap and water for at least 20 seconds and especially before touching your face or eyes. Use paper towels to dry your hands. Take or apply your antibiotic medicine as told by your health care provider. Do not stop using the antibiotic even if your condition improves. Contact a health care provider if you have a fever or if your symptoms do not get better after 10 days. Get help right away if you have a sudden loss of vision. This information is not intended to replace advice given to you by your health care provider. Make sure you discuss any questions you have with your health care provider. Document Revised: 10/19/2020 Document Reviewed: 10/19/2020 Elsevier Patient Education  2024 [employer]  Please schedule a Follow-up Appointment to: Return if symptoms worsen or fail to improve.  If you have any other questions or concerns, please feel free to call the office or send a message through MyChart. You may also schedule an earlier appointment if necessary.  Additionally, you may be receiving a survey about your experience at our office within a few days to 1 week by e-mail or mail. We value your feedback.  Saralyn Pilar, DO [facility], [place]

## 2023-09-13 NOTE — Progress Notes (Signed)
 Subjective:    Patient ID: Katie Fischer, female    DOB: 1956/04/01, 68 y.o.   MRN: [id number]  Katie Fischer is a 68 y.o. female presenting on 09/13/2023 for Conjunctivitis   Virtual / Telehealth Encounter - Video Visit via MyChart The purpose of this virtual visit is to provide medical care while limiting exposure to the novel coronavirus (COVID19) for both patient and office staff.  Consent was obtained for remote visit:  Yes.   Answered questions that patient had about telehealth interaction:  Yes.   I discussed the limitations, risks, security and privacy concerns of performing an evaluation and management service by video/telephone. I also discussed with the patient that there may be a patient responsible charge related to this service. The patient expressed understanding and agreed to proceed.  Patient Location: Home Provider Location: [facility] (Office)  Participants in virtual visit: - Patient: Katie Fischer - CMA: Fuller Plan CMA - Provider: Dr Althea Charon  Patient presents for a same day appointment.  PCP Betty Swaziland MD  HPI  Discussed the use of AI scribe software for clinical note transcription with the patient, who gave verbal consent to proceed.  History of Present Illness   Katie Fischer is a 68 year old female who presents with right eye swelling and discharge.  She first noticed the right eye swelling and discharge yesterday. The eye is swollen and bloodshot, with noticeable discharge, particularly in the morning.  She recently had a cold, which she initially thought might be related to her eye symptoms, but the cold has mostly resolved. There is no history of recent trauma or foreign body exposure to the eye.  She has been using ice packs on the eye since last night but has not used any today. There is no history of similar eye issues in the past, such as conjunctivitis or eye infections.  No redness on the skin around the eye, no spreading  swelling, severe pain, or loss of vision.            08/16/2023    7:30 AM 01/14/2023    8:04 AM 08/14/2022    7:24 AM  Depression screen PHQ 2/9  Decreased Interest 0 0 1  Down, Depressed, Hopeless 0 0 1  PHQ - 2 Score 0 0 2  Altered sleeping 1 0 1  Tired, decreased energy 1 0 2  Change in appetite 1 0 1  Feeling bad or failure about yourself  0 0 1  Trouble concentrating 0 0 1  Moving slowly or fidgety/restless 0 0 0  Suicidal thoughts 0 0 0  PHQ-9 Score 3 0 8  Difficult doing work/chores Not difficult at all Not difficult at all Not difficult at all       08/16/2023    7:31 AM 08/14/2022    7:24 AM 05/22/2021    8:14 AM 09/02/2020    7:57 AM  GAD 7 : Generalized Anxiety Score  Nervous, Anxious, on Edge 0 0 0 1  Control/stop worrying 0 0 0 1  Worry too much - different things 0 0 0 1  Trouble relaxing 1 0 1 2  Restless 0 0 1 1  Easily annoyed or irritable 0 1 1 1   Afraid - awful might happen 0 0 0 2  Total GAD 7 Score 1 1 3 9   Anxiety Difficulty Not difficult at all Not difficult at all Not difficult at all Somewhat difficult    Social History   Tobacco Use  Smoking status: Former    Current packs/day: 0.00    Types: Cigarettes    Quit date: 07/24/1987    Years since quitting: 36.1   Smokeless tobacco: Never  Vaping Use   Vaping status: Never Used  Substance Use Topics   Alcohol use: Yes    Alcohol/week: 7.0 standard drinks of alcohol    Types: 7 Glasses of wine per week    Comment: 1 glass wine daily per pt   Drug use: No    Review of Systems Per HPI unless specifically indicated above     Objective:    There were no vitals taken for this visit.  Wt Readings from Last 3 Encounters:  08/16/23 154 lb 4 oz (70 kg)  05/15/23 161 lb (73 kg)  01/14/23 166 lb 4 oz (75.4 kg)     Physical Exam  Note examination was completely remotely via video observation objective data only  Gen - well-appearing, no acute distress or apparent pain,  comfortable HEENT - R eye appears slightly inflamed with conjunctival injection redness (mild) no obvious drainage or discharge seen, no periorbital edema or erythema. She is able to move her eyes in all directions. Heart/Lungs - cannot examine virtually - observed no evidence of coughing or labored breathing. Abd - cannot examine virtually  Skin - face visible today- no rash Neuro - awake, alert, oriented Psych - not anxious appearing   Results for orders placed or performed in visit on 08/16/23  VITAMIN D 25 Hydroxy (Vit-D Deficiency, Fractures)   Collection Time: 08/16/23  8:00 AM  Result Value Ref Range   VITD 28.68 (L) 30.00 - 100.00 ng/mL  Vitamin B12   Collection Time: 08/16/23  8:00 AM  Result Value Ref Range   Vitamin B-12 397 211 - 911 pg/mL  Basic metabolic panel   Collection Time: 08/16/23  8:00 AM  Result Value Ref Range   Sodium 138 135 - 145 mEq/L   Potassium 4.0 3.5 - 5.1 mEq/L   Chloride 101 96 - 112 mEq/L   CO2 30 19 - 32 mEq/L   Glucose, Bld 95 70 - 99 mg/dL   BUN 12 6 - 23 mg/dL   Creatinine, Ser 1.61 0.40 - 1.20 mg/dL   GFR 09.60 >45.40 mL/min   Calcium 9.2 8.4 - 10.5 mg/dL      Assessment & Plan:   Problem List Items Addressed This Visit   None Visit Diagnoses       Bacterial conjunctivitis    -  Primary   Relevant Medications   trimethoprim-polymyxin b (POLYTRIM) ophthalmic solution       Right Acute Conjunctivitis Suspected bacterial etiology but possible viral Acute onset symptoms following URI. Some discharge notable, only unilateral No severe pain, loss of vision, or skin redness. Since virtual exam, not able to evaluate for stye  -Start Polytrim (polymyxin B-trimethoprim) eye drops, one drop in the right eye four times daily for 7-10 days. -Continue warm compresses. -If left eye becomes involved, start Polytrim in left eye as well.      Follow-up with PCP for in office visit if not improving or new symptoms or concerns.  No orders  of the defined types were placed in this encounter.   Meds ordered this encounter  Medications                  trimethoprim-polymyxin b (POLYTRIM) ophthalmic solution    Sig: Place 1 drop into the right eye 4 (four) times daily. For 7  to 10 days for conjunctivitis    Dispense:  10 mL    Refill:  0    Re-sent to add duration clarification. Please only fill 1 rx.    Follow up plan: Return if symptoms worsen or fail to improve.   Patient verbalizes understanding with the above medical recommendations including the limitation of remote medical advice.  Specific follow-up and call-back criteria were given for patient to follow-up or seek medical care more urgently if needed.  Total duration of direct patient care provided via video conference: 7 minutes   Saralyn Pilar, DO [facility] 09/13/2023, 3:44 PM

## 2023-09-19 ENCOUNTER — Ambulatory Visit: Payer: Medicare HMO | Admitting: Podiatry

## 2023-11-19 DIAGNOSIS — D235 Other benign neoplasm of skin of trunk: Secondary | ICD-10-CM | POA: Diagnosis not present

## 2023-11-19 DIAGNOSIS — L209 Atopic dermatitis, unspecified: Secondary | ICD-10-CM | POA: Diagnosis not present

## 2023-11-19 DIAGNOSIS — L814 Other melanin hyperpigmentation: Secondary | ICD-10-CM | POA: Diagnosis not present

## 2023-11-19 DIAGNOSIS — L579 Skin changes due to chronic exposure to nonionizing radiation, unspecified: Secondary | ICD-10-CM | POA: Diagnosis not present

## 2023-11-19 DIAGNOSIS — L821 Other seborrheic keratosis: Secondary | ICD-10-CM | POA: Diagnosis not present

## 2023-11-19 DIAGNOSIS — D225 Melanocytic nevi of trunk: Secondary | ICD-10-CM | POA: Diagnosis not present

## 2023-11-26 DIAGNOSIS — H35363 Drusen (degenerative) of macula, bilateral: Secondary | ICD-10-CM | POA: Diagnosis not present

## 2023-11-26 DIAGNOSIS — H04123 Dry eye syndrome of bilateral lacrimal glands: Secondary | ICD-10-CM | POA: Diagnosis not present

## 2023-11-26 DIAGNOSIS — H532 Diplopia: Secondary | ICD-10-CM | POA: Diagnosis not present

## 2023-11-26 DIAGNOSIS — H26493 Other secondary cataract, bilateral: Secondary | ICD-10-CM | POA: Diagnosis not present

## 2023-12-13 ENCOUNTER — Other Ambulatory Visit: Payer: Self-pay | Admitting: Podiatry

## 2024-01-30 ENCOUNTER — Ambulatory Visit: Admitting: Family Medicine

## 2024-01-30 DIAGNOSIS — Z0000 Encounter for general adult medical examination without abnormal findings: Secondary | ICD-10-CM | POA: Diagnosis not present

## 2024-01-30 NOTE — Progress Notes (Signed)
 PATIENT CHECK-IN and HEALTH RISK ASSESSMENT QUESTIONNAIRE:  -completed by phone/video for upcoming Medicare Preventive Visit  -PLEASE SELECT NOT IN PERSON for the method of visit.   Pre-Visit Check-in: 1)Vitals (height, wt, BP, etc) - record in vitals section for visit on day of visit Request home vitals (wt, BP, etc.) and enter into vitals, THEN update Vital Signs SmartPhrase below at the top of the HPI. See below.  2)Review and Update Medications, Allergies PMH, Surgeries, Social history in Epic 3)Hospitalizations in the last year with date/reason? n  4)Review and Update Care Team (patient's specialists) in Epic 5) Complete PHQ9 in Epic  6) Complete Fall Screening in Epic 7)Review all Health Maintenance Due and order under PCP if not done.  Medicare Wellness Patient Questionnaire:  Answer theses question about your habits: How often do you have a drink containing alcohol? daily How many drinks containing alcohol do you have on a typical day when you are drinking?1-2 glasses How often do you have six or more drinks on one occasion?n Have you ever smoked?y Quit date if applicable? 1989  How many packs a day do/did you smoke? 1-2 ppd Do you use smokeless tobacco?n Do you use an illicit drugs?n On average, how many days per week do you engage in moderate to strenuous exercise (like a brisk walk)? Daily, body pump, interval training, athletic conditioning, zumba On average, how many minutes do you engage in exercise at this level? 1-2 hour Are you sexually active? n Typical breakfast/brunch: eggs, cereal occasionally, oatmeal Typical lunch: usually skips lunch Typical dinner: chicken, lots of veggies, occ meat Typical snacks:no  Beverages: seltzer water   Answer theses question about your everyday activities: Can you perform most household chores?y Are you deaf or have significant trouble hearing?n Do you feel that you have a problem with memory?n Do you feel safe at  home?y Last dentist visit?y 8. Do you have any difficulty performing your everyday activities?n Are you having any difficulty walking, taking medications on your own, and or difficulty managing daily home needs?n Do you have difficulty walking or climbing stairs?n Do you have difficulty dressing or bathing?n Do you have difficulty doing errands alone such as visiting a doctor's office or shopping?n Do you currently have any difficulty preparing food and eating?n Do you currently have any difficulty using the toilet?n Do you have any difficulty managing your finances?n Do you have any difficulties with housekeeping of managing your housekeeping?n   Do you have Advanced Directives in place (Living Will, Healthcare Power or Attorney)? n   Last eye Exam and location? Goes once a year, Dr. Craige office   Do you currently use prescribed or non-prescribed narcotic or opioid pain medications?n  Do you have a history or close family history of breast, ovarian, tubal or peritoneal cancer or a family member with BRCA (breast cancer susceptibility 1 and 2) gene mutations? n    ----------------------------------------------------------------------------------------------------------------------------------------------------------------------------------------------------------------------  Because this visit was a virtual/telehealth visit, some criteria may be missing or patient reported. Any vitals not documented were not able to be obtained and vitals that have been documented are patient reported.    MEDICARE ANNUAL PREVENTIVE VISIT WITH PROVIDER: (Welcome to Medicare, initial annual wellness or annual wellness exam)  Virtual Visit via Video Note  I connected with Katie Fischer on 01/30/24 by a video enabled telemedicine application and verified that I am speaking with the correct person using two identifiers.  Location patient: home Location provider:work or home office Persons  participating in the virtual  visit: patient, provider  Concerns and/or follow up today: no concerns   See HM section in Epic for other details of completed HM.    ROS: negative for report of fevers, unintentional weight loss, vision changes, vision loss, hearing loss or change, chest pain, sob, hemoptysis, melena, hematochezia, hematuria, falls, bleeding or bruising, thoughts of suicide or self harm, memory loss  Patient-completed extensive health risk assessment - reviewed and discussed with the patient: See Health Risk Assessment completed with patient prior to the visit either above or in recent phone note. This was reviewed in detailed with the patient today and appropriate recommendations, orders and referrals were placed as needed per Summary below and patient instructions.   Review of Medical History: -PMH, PSH, Family History and current specialty and care providers reviewed and updated and listed below   Patient Care Team: [place], Betty G, MD as PCP - General (Family Medicine)   Past Medical History:  Diagnosis Date   Allergy    Depression    Eating disorder    as a teenager   Headache(784.0)    PVD (peripheral vascular disease) (HCC)     Past Surgical History:  Procedure Laterality Date   COLONOSCOPY  10 years ago    pt doesn't remember.   SKIN CANCER EXCISION     TONSILLECTOMY      Social History   Socioeconomic History   Marital status: Widowed    Spouse name: Not on file   Number of children: Not on file   Years of education: Not on file   Highest education level: Bachelor's degree (e.g., BA, AB, BS)  Occupational History   Not on file  Tobacco Use   Smoking status: Former    Current packs/day: 0.00    Types: Cigarettes    Quit date: 07/24/1987    Years since quitting: 36.5   Smokeless tobacco: Never  Vaping Use   Vaping status: Never Used  Substance and Sexual Activity   Alcohol use: Yes    Alcohol/week: 7.0 standard drinks of alcohol    Types:  7 Glasses of wine per week    Comment: 1 glass wine daily per pt   Drug use: No   Sexual activity: Not Currently  Other Topics Concern   Not on file  Social History Narrative   Not on file   Social Drivers of Health   Financial Resource Strain: Low Risk  (01/30/2024)   Overall Financial Resource Strain (CARDIA)    Difficulty of Paying Living Expenses: Not hard at all  Food Insecurity: No Food Insecurity (01/30/2024)   Hunger Vital Sign    Worried About Running Out of Food in the Last Year: Never true    Ran Out of Food in the Last Year: Never true  Transportation Needs: No Transportation Needs (01/30/2024)   PRAPARE - Administrator, Civil Service (Medical): No    Lack of Transportation (Non-Medical): No  Physical Activity: Sufficiently Active (01/30/2024)   Exercise Vital Sign    Days of Exercise per Week: 5 days    Minutes of Exercise per Session: 60 min  Stress: No Stress Concern Present (01/30/2024)   [employer] of Occupational Health - Occupational Stress Questionnaire    Feeling of Stress: Only a little  Social Connections: Moderately Integrated (01/30/2024)   Social Connection and Isolation Panel    Frequency of Communication with Friends and Family: More than three times a week    Frequency of Social Gatherings with Friends and  Family: More than three times a week    Attends Religious Services: More than 4 times per year    Active Member of Clubs or Organizations: Yes    Attends Banker Meetings: More than 4 times per year    Marital Status: Widowed  Catering manager Violence: Not on file    Family History  Problem Relation Age of Onset   Diabetes Mother    Hypertension Mother    Hyperlipidemia Mother    Alcohol abuse Mother    Thyroid  disease Mother    COPD Father    Colon cancer Neg Hx    Esophageal cancer Neg Hx    Stomach cancer Neg Hx    Rectal cancer Neg Hx     Current Outpatient Medications on File Prior to Visit   Medication Sig Dispense Refill   calcium carbonate 200 MG capsule Take 250 mg by mouth daily.     doxepin  (SINEQUAN ) 25 MG capsule TAKE 1 CAPSULE BY MOUTH EVERYDAY AT BEDTIME 90 capsule 3   fexofenadine (ALLEGRA) 180 MG tablet Take 180 mg by mouth daily.     FLUoxetine  (PROZAC ) 20 MG capsule TAKE 1 CAPSULE BY MOUTH EVERY DAY 90 capsule 3   Multiple Vitamin (MULTIVITAMIN) tablet Take 1 tablet by mouth daily.     Probiotic Product (PROBIOTIC DAILY PO) Take 1 capsule by mouth daily.     No current facility-administered medications on file prior to visit.    Allergies  Allergen Reactions   Codeine     REACTION: nausea       Physical Exam Vitals requested from patient and listed below if patient had equipment and was able to obtain at home for this virtual visit: There were no vitals filed for this visit. Estimated body mass index is 25.28 kg/m as calculated from the following:   Height as of 08/16/23: 5' 5.5 (1.664 m).   Weight as of 08/16/23: 154 lb 4 oz (70 kg).  EKG (optional): deferred due to virtual visit  GENERAL: alert, oriented, no acute distress detected, full vision exam deferred due to pandemic and/or virtual encounter  HEENT: atraumatic, conjunttiva clear, no obvious abnormalities on inspection of external nose and ears  NECK: normal movements of the head and neck  LUNGS: on inspection no signs of respiratory distress, breathing rate appears normal, no obvious gross SOB, gasping or wheezing  CV: no obvious cyanosis  MS: moves all visible extremities without noticeable abnormality  PSYCH/NEURO: pleasant and cooperative, no obvious depression or anxiety, speech and thought processing grossly intact, Cognitive function grossly intact  Flowsheet Row Office Visit from 08/16/2023 in [facility]  PHQ-9 Total Score 3        01/30/2024    5:02 PM 08/16/2023    7:30 AM 01/14/2023    8:04 AM 08/14/2022    7:24 AM 05/22/2021    8:05 AM   Depression screen PHQ 2/9  Decreased Interest 0 0 0 1 0  Down, Depressed, Hopeless 0 0 0 1 0  PHQ - 2 Score 0 0 0 2 0  Altered sleeping  1 0 1 3  Tired, decreased energy  1 0 2 1  Change in appetite  1 0 1 1  Feeling bad or failure about yourself   0 0 1 0  Trouble concentrating  0 0 1 0  Moving slowly or fidgety/restless  0 0 0 0  Suicidal thoughts  0 0 0 0  PHQ-9 Score  3  0 8 5  Difficult doing work/chores  Not difficult at all Not difficult at all Not difficult at all Not difficult at all       05/22/2021    5:01 PM 08/14/2022    7:24 AM 01/13/2023    3:06 PM 01/30/2024   12:48 PM 01/30/2024    5:02 PM  Fall Risk  Falls in the past year? 0 0 0 0 0  Was there an injury with Fall? 0 0   0  Fall Risk Category Calculator 0 0   0  Fall Risk Category (Retired) Low       (RETIRED) Patient Fall Risk Level Low fall risk       Patient at Risk for Falls Due to  No Fall Risks     Fall risk Follow up Education provided  Falls evaluation completed    Falls evaluation completed     Data saved with a previous flowsheet row definition     SUMMARY AND PLAN:  Encounter for Medicare annual wellness exam  Discussed applicable health maintenance/preventive health measures and advised and referred or ordered per patient preferences: -discussed vaccine recs/risks - advised can get at the pharmacy and to let us  know if does so that we can update chart -advised bone dexa due, PCP ordered this year,  she plans to call to schedule -advised mammogram yearly - she plans to do so in november Health Maintenance  Topic Date Due   COVID-19 Vaccine (7 - 2024-25 season) 04/05/2023   INFLUENZA VACCINE  02/21/2024   Medicare Annual Wellness (AWV)  01/29/2025   MAMMOGRAM  06/16/2025   DTaP/Tdap/Td (3 - Td or Tdap) 01/20/2026   Colonoscopy  03/16/2027   Pneumococcal Vaccine: 50+ Years  Completed   DEXA SCAN  Completed   Hepatitis C Screening  Completed   Zoster Vaccines- Shingrix  Completed    Hepatitis B Vaccines  Aged Out   HPV VACCINES  Aged Out   Meningococcal B Vaccine  Aged Out      Education and counseling on the following was provided based on the above review of health and a plan/checklist for the patient, along with additional information discussed, was provided for the patient in the patient instructions :  -Advised on importance of completing advanced directives, discussed options for completing and provided information in patient instructions as well -Advised and counseled on a healthy lifestyle - including the importance of a healthy diet, regular physical activity, social connections  -Reviewed patient's current diet and congratulated on healthy choices. Discussed increasing protein with suggestions for healthy protein sources. A summary of a healthy diet was provided in the Patient Instructions.  -reviewed patient's current physical activity level and discussed exercise guidelines for adults. Congratulated on healthy choices and further resources provided in patient instructions.  -Advise yearly dental visits at minimum and regular eye exams -Advise to limit alcohol to 1 drink per day  Follow up: see patient instructions     Patient Instructions  I really enjoyed getting to talk with you today! I am available on Tuesdays and Thursdays for virtual visits if you have any questions or concerns, or if I can be of any further assistance.   CHECKLIST FROM ANNUAL WELLNESS VISIT:  -Follow up (please call to schedule if not scheduled after visit):   -yearly for annual wellness visit with primary care office  Here is a list of your preventive care/health maintenance measures and the plan for each if any are due:  PLAN For  any measures below that may be due:    1. Call to schedule your bone density   2. Can get vaccines at the pharmacy, please let us  know if you do so that we can update your record  Health Maintenance  Topic Date Due   COVID-19 Vaccine (7 -  2024-25 season) 04/05/2023   INFLUENZA VACCINE  02/21/2024   Medicare Annual Wellness (AWV)  01/29/2025   MAMMOGRAM  06/16/2025   DTaP/Tdap/Td (3 - Td or Tdap) 01/20/2026   Colonoscopy  03/16/2027   Pneumococcal Vaccine: 50+ Years  Completed   DEXA SCAN  Completed   Hepatitis C Screening  Completed   Zoster Vaccines- Shingrix  Completed   Hepatitis B Vaccines  Aged Out   HPV VACCINES  Aged Out   Meningococcal B Vaccine  Aged Out    -See a dentist at least yearly  -Get your eyes checked and then per your eye specialist's recommendations  -Other issues:   1. Please limit alcohol to 1 drink per any given day   -I have included below further information regarding a healthy whole foods based diet, physical activity guidelines for adults, stress management and opportunities for social connections. I hope you find this information useful.   -----------------------------------------------------------------------------------------------------------------------------------------------------------------------------------------------------------------------------------------------------------    NUTRITION: -eat real food: lots of colorful vegetables (half the plate) and fruits -5-7 servings of vegetables and fruits per day (fresh or steamed is best), exp. 2 servings of vegetables with lunch and dinner and 2 servings of fruit per day. Berries and greens such as kale and collards are great choices.  -consume on a regular basis:  fresh fruits, fresh veggies, fish, nuts, seeds, healthy oils (such as olive oil, avocado oil), whole grains (make sure for bread/pasta/crackers/etc., that the first ingredient on label contains the word whole), legumes. -can eat small amounts of dairy and lean meat (no larger than the palm of your hand), but avoid processed meats such as ham, bacon, lunch meat, etc. -drink water -try to avoid fast food and pre-packaged foods, processed meat, ultra processed  foods/beverages (donuts, candy, etc.) -most experts advise limiting sodium to < 2300mg  per day, should limit further is any chronic conditions such as high blood pressure, heart disease, diabetes, etc. The American Heart Association advised that < 1500mg  is is ideal -try to avoid foods/beverages that contain any ingredients with names you do not recognize  -try to avoid foods/beverages  with added sugar or sweeteners/sweets  -try to avoid sweet drinks (including diet drinks): soda, juice, Gatorade, sweet tea, power drinks, diet drinks -try to avoid white rice, white bread, pasta (unless whole grain)  EXERCISE GUIDELINES FOR ADULTS: -if you wish to increase your physical activity, do so gradually and with the approval of your doctor -STOP and seek medical care immediately if you have any chest pain, chest discomfort or trouble breathing when starting or increasing exercise  -move and stretch your body, legs, feet and arms when sitting for long periods -Physical activity guidelines for optimal health in adults: -get at least 150 minutes per week of moderate exercise (can talk, but not sing); this is about 20-30 minutes of sustained activity 5-7 days per week or two 10-15 minute episodes of sustained activity 5-7 days per week -do some muscle building/resistance training/strength training at least 2 days per week  -balance exercises 3+ days per week:   Stand somewhere where you have something sturdy to hold onto if you lose balance    1) lift up on toes, then back  down, start with 5x per day and work up to 20x   2) stand and lift one leg straight out to the side so that foot is a few inches of the floor, start with 5x each side and work up to 20x each side   3) stand on one foot, start with 5 seconds each side and work up to 20 seconds on each side  If you need ideas or help with getting more active:  -Silver sneakers https://tools.silversneakers.com  -Walk with a  Doc: http://www.duncan-williams.com/  -try to include resistance (weight lifting/strength building) and balance exercises twice per week: or the following link for ideas: http://castillo-powell.com/  BuyDucts.dk  STRESS MANAGEMENT: -can try meditating, or just sitting quietly with deep breathing while intentionally relaxing all parts of your body for 5 minutes daily -if you need further help with stress, anxiety or depression please follow up with your primary doctor or contact the wonderful folks at [facility]: [phone]  SOCIAL CONNECTIONS: -options in [place] if you wish to engage in more social and exercise related activities:  -Silver sneakers https://tools.silversneakers.com  -Walk with a Doc: http://www.duncan-williams.com/  -Check out the [facility] Active Adults 50+ section on the [place] [employer] (hiking clubs, book clubs, cards and games, chess, exercise classes, aquatic classes and much more) - see the website for details: https://www.[place].gov/departments/parks-recreation/active-adults50  -YouTube has lots of exercise videos for different ages and abilities as well  -[facility] (a variety of indoor and outdoor inperson activities for adults). 8387927739. [address]  -Virtual Online Classes (a variety of topics): see seniorplanet.org or call [phone]  -consider volunteering at a school, hospice center, church, senior center or elsewhere   ADVANCED HEALTHCARE DIRECTIVES:  [place] Advanced Directives assistance:   ExpressWeek.com.cy  Everyone should have advanced health care directives in place. This is so that you get the care you want, should you ever be in a situation where you are unable to make your own medical decisions.   From the [place] Advanced Directive  Website: Advance Health Care Directives are legal documents in which you give written instructions about your health care if, in the future, you cannot speak for yourself.   A health care power of attorney allows you to name a person you trust to make your health care decisions if you cannot make them yourself. A declaration of a desire for a natural death (or living will) is document, which states that you desire not to have your life prolonged by extraordinary measures if you have a terminal or incurable illness or if you are in a vegetative state. An advance instruction for mental health treatment makes a declaration of instructions, information and preferences regarding your mental health treatment. It also states that you are aware that the advance instruction authorizes a mental health treatment provider to act according to your wishes. It may also outline your consent or refusal of mental health treatment. A declaration of an anatomical gift allows anyone over the age of 61 to make a gift by will, organ donor card or other document.   Please see the following website or an elder law attorney for forms, FAQs and for completion of advanced directives: [place]  Print production planner Health Care Directives Advance Health Care Directives (http://guzman.com/)  Or copy and paste the following to your web browser: [email or web address]           Chiquita JONELLE Cramp, DO

## 2024-01-30 NOTE — Patient Instructions (Signed)
 I really enjoyed getting to talk with you today! I am available on Tuesdays and Thursdays for virtual visits if you have any questions or concerns, or if I can be of any further assistance.   CHECKLIST FROM ANNUAL WELLNESS VISIT:  -Follow up (please call to schedule if not scheduled after visit):   -yearly for annual wellness visit with primary care office  Here is a list of your preventive care/health maintenance measures and the plan for each if any are due:  PLAN For any measures below that may be due:    1. Call to schedule your bone density   2. Can get vaccines at the pharmacy, please let us  know if you do so that we can update your record  Health Maintenance  Topic Date Due   COVID-19 Vaccine (7 - 2024-25 season) 04/05/2023   INFLUENZA VACCINE  02/21/2024   Medicare Annual Wellness (AWV)  01/29/2025   MAMMOGRAM  06/16/2025   DTaP/Tdap/Td (3 - Td or Tdap) 01/20/2026   Colonoscopy  03/16/2027   Pneumococcal Vaccine: 50+ Years  Completed   DEXA SCAN  Completed   Hepatitis C Screening  Completed   Zoster Vaccines- Shingrix  Completed   Hepatitis B Vaccines  Aged Out   HPV VACCINES  Aged Out   Meningococcal B Vaccine  Aged Out    -See a dentist at least yearly  -Get your eyes checked and then per your eye specialist's recommendations  -Other issues:   1. Please limit alcohol to 1 drink per any given day   -I have included below further information regarding a healthy whole foods based diet, physical activity guidelines for adults, stress management and opportunities for social connections. I hope you find this information useful.   -----------------------------------------------------------------------------------------------------------------------------------------------------------------------------------------------------------------------------------------------------------    NUTRITION: -eat real food: lots of colorful vegetables (half the plate) and  fruits -5-7 servings of vegetables and fruits per day (fresh or steamed is best), exp. 2 servings of vegetables with lunch and dinner and 2 servings of fruit per day. Berries and greens such as kale and collards are great choices.  -consume on a regular basis:  fresh fruits, fresh veggies, fish, nuts, seeds, healthy oils (such as olive oil, avocado oil), whole grains (make sure for bread/pasta/crackers/etc., that the first ingredient on label contains the word whole), legumes. -can eat small amounts of dairy and lean meat (no larger than the palm of your hand), but avoid processed meats such as ham, bacon, lunch meat, etc. -drink water -try to avoid fast food and pre-packaged foods, processed meat, ultra processed foods/beverages (donuts, candy, etc.) -most experts advise limiting sodium to < 2300mg  per day, should limit further is any chronic conditions such as high blood pressure, heart disease, diabetes, etc. The American Heart Association advised that < 1500mg  is is ideal -try to avoid foods/beverages that contain any ingredients with names you do not recognize  -try to avoid foods/beverages  with added sugar or sweeteners/sweets  -try to avoid sweet drinks (including diet drinks): soda, juice, Gatorade, sweet tea, power drinks, diet drinks -try to avoid white rice, white bread, pasta (unless whole grain)  EXERCISE GUIDELINES FOR ADULTS: -if you wish to increase your physical activity, do so gradually and with the approval of your doctor -STOP and seek medical care immediately if you have any chest pain, chest discomfort or trouble breathing when starting or increasing exercise  -move and stretch your body, legs, feet and arms when sitting for long periods -Physical activity guidelines for optimal  health in adults: -get at least 150 minutes per week of moderate exercise (can talk, but not sing); this is about 20-30 minutes of sustained activity 5-7 days per week or two 10-15 minute episodes of  sustained activity 5-7 days per week -do some muscle building/resistance training/strength training at least 2 days per week  -balance exercises 3+ days per week:   Stand somewhere where you have something sturdy to hold onto if you lose balance    1) lift up on toes, then back down, start with 5x per day and work up to 20x   2) stand and lift one leg straight out to the side so that foot is a few inches of the floor, start with 5x each side and work up to 20x each side   3) stand on one foot, start with 5 seconds each side and work up to 20 seconds on each side  If you need ideas or help with getting more active:  -Silver sneakers https://tools.silversneakers.com  -Walk with a Doc: http://www.duncan-williams.com/  -try to include resistance (weight lifting/strength building) and balance exercises twice per week: or the following link for ideas: http://castillo-powell.com/  BuyDucts.dk  STRESS MANAGEMENT: -can try meditating, or just sitting quietly with deep breathing while intentionally relaxing all parts of your body for 5 minutes daily -if you need further help with stress, anxiety or depression please follow up with your primary doctor or contact the wonderful folks at [facility]: [phone]  SOCIAL CONNECTIONS: -options in [place] if you wish to engage in more social and exercise related activities:  -Silver sneakers https://tools.silversneakers.com  -Walk with a Doc: http://www.duncan-williams.com/  -Check out the [facility] Active Adults 50+ section on the [place] of [employer] (hiking clubs, book clubs, cards and games, chess, exercise classes, aquatic classes and much more) - see the website for details: https://www.[place].gov/departments/parks-recreation/active-adults50  -YouTube has lots of exercise videos for different ages and abilities as well  -[place]  [facility] (a variety of indoor and outdoor inperson activities for adults). [phone]. [address].  -Virtual Online Classes (a variety of topics): see seniorplanet.org or call [phone]  -consider volunteering at a school, hospice center, church, senior center or elsewhere   ADVANCED HEALTHCARE DIRECTIVES:  [place] Advanced Directives assistance:   ExpressWeek.com.cy  Everyone should have advanced health care directives in place. This is so that you get the care you want, should you ever be in a situation where you are unable to make your own medical decisions.   From the [place] Advanced Directive Website: Advance Health Care Directives are legal documents in which you give written instructions about your health care if, in the future, you cannot speak for yourself.   A health care power of attorney allows you to name a person you trust to make your health care decisions if you cannot make them yourself. A declaration of a desire for a natural death (or living will) is document, which states that you desire not to have your life prolonged by extraordinary measures if you have a terminal or incurable illness or if you are in a vegetative state. An advance instruction for mental health treatment makes a declaration of instructions, information and preferences regarding your mental health treatment. It also states that you are aware that the advance instruction authorizes a mental health treatment provider to act according to your wishes. It may also outline your consent or refusal of mental health treatment. A declaration of an anatomical gift allows anyone over the age of  18 to make a gift by will, organ donor card or other document.   Please see the following website or an elder law attorney for forms, FAQs and for completion of advanced directives: [place]  Print production planner Health Care Directives  Advance Health Care Directives (http://guzman.[email or web address]/)  Or copy and paste the following to your web browser: PoshChat.fi

## 2024-02-14 DIAGNOSIS — M8588 Other specified disorders of bone density and structure, other site: Secondary | ICD-10-CM | POA: Diagnosis not present

## 2024-02-14 DIAGNOSIS — N958 Other specified menopausal and perimenopausal disorders: Secondary | ICD-10-CM | POA: Diagnosis not present

## 2024-02-14 DIAGNOSIS — E2839 Other primary ovarian failure: Secondary | ICD-10-CM | POA: Diagnosis not present

## 2024-02-14 LAB — HM DEXA SCAN

## 2024-02-24 ENCOUNTER — Encounter (HOSPITAL_BASED_OUTPATIENT_CLINIC_OR_DEPARTMENT_OTHER): Payer: Self-pay

## 2024-02-24 ENCOUNTER — Emergency Department (HOSPITAL_BASED_OUTPATIENT_CLINIC_OR_DEPARTMENT_OTHER)
Admission: EM | Admit: 2024-02-24 | Discharge: 2024-02-24 | Disposition: A | Discharge status: Home / Self Care | Attending: Emergency Medicine | Admitting: Emergency Medicine

## 2024-02-24 ENCOUNTER — Other Ambulatory Visit: Payer: Self-pay

## 2024-02-24 DIAGNOSIS — T148XXA Other injury of unspecified body region, initial encounter: Secondary | ICD-10-CM

## 2024-02-24 DIAGNOSIS — W268XXA Contact with other sharp object(s), not elsewhere classified, initial encounter: Secondary | ICD-10-CM | POA: Insufficient documentation

## 2024-02-24 DIAGNOSIS — S61200A Unspecified open wound of right index finger without damage to nail, initial encounter: Secondary | ICD-10-CM | POA: Insufficient documentation

## 2024-02-24 NOTE — ED Triage Notes (Signed)
 Pt presents with an avulsion cut to the pad of her R index finger that happened at 1530. Pt is unable to get the bleeding to stop

## 2024-02-24 NOTE — ED Notes (Signed)
 Pt provided discharge instructions and prescription information. Pt was given the opportunity to ask questions and questions were answered.

## 2024-02-24 NOTE — ED Provider Notes (Signed)
 [place] EMERGENCY DEPARTMENT AT [facility] Provider Note   CSN: [id number] Arrival date & time: 02/24/24  1827     Patient presents with: Finger Injury   Katie Fischer is a 68 y.o. female.   68 yo F with a chief complaint of avulsion to her finger.  She is brought in new vegetable peeler and when she was unpacking it sliced her finger.  She has been trying to stop bleeding for about 3 hours, and came in for eval.         Prior to Admission medications   Medication Sig Start Date End Date Taking? Authorizing Provider  calcium carbonate 200 MG capsule Take 250 mg by mouth daily.    [provider]  doxepin  (SINEQUAN ) 25 MG capsule TAKE 1 CAPSULE BY MOUTH EVERYDAY AT BEDTIME 08/16/23   [place], Betty G, MD  fexofenadine (ALLEGRA) 180 MG tablet Take 180 mg by mouth daily.    [provider]  FLUoxetine  (PROZAC ) 20 MG capsule TAKE 1 CAPSULE BY MOUTH EVERY DAY 08/16/23   [place], Betty G, MD  Multiple Vitamin (MULTIVITAMIN) tablet Take 1 tablet by mouth daily.    [provider]  Probiotic Product (PROBIOTIC DAILY PO) Take 1 capsule by mouth daily.    [provider]    Allergies: Codeine    Review of Systems  Updated Vital Signs BP (!) 144/98 (BP Location: Left Arm)   Pulse 74   Temp 98 F (36.7 C) (Oral)   Resp 20   Ht 5' 4.5 (1.638 m)   Wt 68.9 kg   SpO2 99%   BMI 25.69 kg/m   Physical Exam Vitals and nursing note reviewed.  Constitutional:      General: She is not in acute distress.    Appearance: She is well-developed. She is not diaphoretic.  HENT:     Head: Normocephalic and atraumatic.  Eyes:     Pupils: Pupils are equal, round, and reactive to light.  Cardiovascular:     Rate and Rhythm: Normal rate and regular rhythm.     Heart sounds: No murmur heard.    No friction rub. No gallop.  Pulmonary:     Effort: Pulmonary effort is normal.     Breath sounds: No wheezing or rales.  Abdominal:     General: There is  no distension.     Palpations: Abdomen is soft.     Tenderness: There is no abdominal tenderness.  Musculoskeletal:        General: No tenderness.     Cervical back: Normal range of motion and neck supple.     Comments: Avulsion to the right second digit along the finger pad.  Mild oozing.  Skin:    General: Skin is warm and dry.  Neurological:     Mental Status: She is alert and oriented to person, place, and time.  Psychiatric:        Behavior: Behavior normal.     (all labs ordered are listed, but only abnormal results are displayed) Labs Reviewed - No data to display  EKG: None  Radiology: No results found.   Procedures   Medications Ordered in the ED - No data to display                                  Medical Decision Making  68 yo F with a chief complaints of an avulsion injury to the  tip of her right finger.  Unfortunately nothing that could be closed.  Bleeding was controlled by nursing.  Will discharge home.  PCP follow-up.  7:40 PM:  I have discussed the diagnosis/risks/treatment options with the patient.  Evaluation and diagnostic testing in the emergency department does not suggest an emergent condition requiring admission or immediate intervention beyond what has been performed at this time.  They will follow up with PCP. We also discussed returning to the ED immediately if new or worsening sx occur. We discussed the sx which are most concerning (e.g., sudden worsening pain, fever, inability to tolerate by mouth) that necessitate immediate return. Medications administered to the patient during their visit and any new prescriptions provided to the patient are listed below.  Medications given during this visit Medications - No data to display   The patient appears reasonably screen and/or stabilized for discharge and I doubt any other medical condition or other [facility] requiring further screening, evaluation, or treatment in the ED at this time prior to discharge.        Final diagnoses:  Avulsion injury    ED Discharge Orders     None          Emil Share, DO 02/24/24 1940

## 2024-02-24 NOTE — Discharge Instructions (Signed)
 Follow-up your family doctor in the office.

## 2024-02-28 ENCOUNTER — Telehealth: Payer: Self-pay

## 2024-02-28 NOTE — Transitions of Care (Post Inpatient/ED Visit) (Signed)
   02/28/2024  Name: Katie Fischer MRN: [id number] DOB: 09/23/1955  Today's TOC FU Call Status:    Attempted to reach the patient regarding the most recent Inpatient/ED visit.  Follow Up Plan: Additional outreach attempts will be made to reach the patient to complete the Transitions of Care (Post Inpatient/ED visit) call.   Signature: Jossette Zirbel, CMA

## 2024-03-05 ENCOUNTER — Telehealth: Payer: Self-pay | Admitting: *Deleted

## 2024-03-05 NOTE — Telephone Encounter (Signed)
 Copied from CRM [id number]. Topic: Clinical - Lab/Test Results >> Mar 05, 2024  1:12 PM Katie Fischer wrote: Reason for CRM: Pt returned call. She believes the call is regarding her bone density test results but is unsure. Did not see results in chart. Please call.

## 2024-06-13 ENCOUNTER — Ambulatory Visit: Payer: Self-pay | Admitting: Family Medicine

## 2024-06-17 DIAGNOSIS — Z1231 Encounter for screening mammogram for malignant neoplasm of breast: Secondary | ICD-10-CM | POA: Diagnosis not present

## 2024-06-17 LAB — HM MAMMOGRAPHY

## 2024-07-20 ENCOUNTER — Ambulatory Visit (INDEPENDENT_AMBULATORY_CARE_PROVIDER_SITE_OTHER): Admitting: Family Medicine

## 2024-07-20 VITALS — BP 126/74 | HR 70 | Temp 98.1°F | Wt 157.2 lb

## 2024-07-20 DIAGNOSIS — M7918 Myalgia, other site: Secondary | ICD-10-CM | POA: Diagnosis not present

## 2024-07-20 MED ORDER — SCOPOLAMINE 1 MG/3DAYS TD PT72: 1.0000 | MEDICATED_PATCH | TRANSDERMAL | 0 refills | Status: AC

## 2024-07-20 NOTE — Progress Notes (Signed)
 "  Established Patient Office Visit  Subjective   Patient ID: Katie Fischer, female    DOB: 1955/08/04  Age: 68 y.o. MRN: [id number]  Chief Complaint  Patient presents with   Arm Pain    HPI   Katie Fischer is seen with almost 53-month history of left lateral deltoid pain.  She is very health-conscious and exercises regularly.  She recently has had to avoid certain lifting maneuvers because of some left lateral arm pain.  No visible swelling.  No bruising.  No neck pain.  She is able to tolerate push-ups without difficulty.  Has had some difficulty with abduction but only to a certain degree.  After about 90 degrees of abduction does not seem to have any pain beyond that.  Quit doing body pump exercise about 6 weeks ago.  Right-hand-dominant.  Denies any left upper extremity weakness.  He enjoys oral travel and has upcoming trip to [place].  Requesting scopolamine  patches.  Will be gone for several weeks.  No history of glaucoma.  No history of bowel obstruction.  Past Medical History:  Diagnosis Date   Allergy    Depression    Eating disorder    as a teenager   Headache(784.0)    PVD (peripheral vascular disease)    Past Surgical History:  Procedure Laterality Date   COLONOSCOPY  10 years ago    pt doesn't remember.   SKIN CANCER EXCISION     TONSILLECTOMY      reports that she quit smoking about 37 years ago. Her smoking use included cigarettes. She has never used smokeless tobacco. She reports current alcohol use of about 7.0 standard drinks of alcohol per week. She reports that she does not use drugs. family history includes Alcohol abuse in her mother; COPD in her father; Diabetes in her mother; Hyperlipidemia in her mother; Hypertension in her mother; Thyroid  disease in her mother. Allergies[1]  Review of Systems  Cardiovascular:  Negative for chest pain.  Musculoskeletal:  Negative for neck pain.  Neurological:  Negative for sensory change, focal weakness and weakness.       Objective:     BP 126/74   Pulse 70   Temp 98.1 F (36.7 C) (Oral)   Wt 157 lb 3.2 oz (71.3 kg)   SpO2 95%   BMI 26.57 kg/m  BP Readings from Last 3 Encounters:  07/20/24 126/74  02/24/24 (!) 144/98  08/16/23 126/80   Wt Readings from Last 3 Encounters:  07/20/24 157 lb 3.2 oz (71.3 kg)  02/24/24 152 lb (68.9 kg)  08/16/23 154 lb 4 oz (70 kg)      Physical Exam Vitals reviewed.  Constitutional:      General: She is not in acute distress.    Appearance: She is not ill-appearing.  Cardiovascular:     Rate and Rhythm: Normal rate and regular rhythm.  Musculoskeletal:     Cervical back: Neck supple.     Comments: Left shoulder reveals full range of motion.  No acromioclavicular tenderness.  No biceps tenderness.  No subacromial tenderness.  No pain with internal rotation.  Minimal pain with abduction to about 90 degrees against resistance.  No rotator cuff weakness.  Neurological:     Mental Status: She is alert.      No results found for any visits on 07/20/24.    The 10-year ASCVD risk score (Arnett DK, et al., 2019) is: 7.6%    Assessment & Plan:   Left lateral deltoid pain.  Differential  is supraspinatus tendinitis versus deltoid strain.  We discussed modifying activities to avoid lifts that are aggravating symptoms.  Maintain range of motion.  Avoid regular use of nonsteroidals.  If symptoms not further improved in 1 month consider sports medicine referral  Katie Scarlet, MD     [1]  Allergies Allergen Reactions   Codeine     REACTION: nausea   "

## 2024-08-07 ENCOUNTER — Other Ambulatory Visit: Payer: Self-pay | Admitting: Family Medicine

## 2024-08-07 DIAGNOSIS — F419 Anxiety disorder, unspecified: Secondary | ICD-10-CM

## 2024-08-07 DIAGNOSIS — F3341 Major depressive disorder, recurrent, in partial remission: Secondary | ICD-10-CM

## 2024-08-07 DIAGNOSIS — G4700 Insomnia, unspecified: Secondary | ICD-10-CM
